# Patient Record
Sex: Female | Born: 1950
Health system: Southern US, Community
[De-identification: ages and names within clinical notes are randomized; demographics above are authoritative.]

## PROBLEM LIST (undated history)

## (undated) DIAGNOSIS — E662 Morbid (severe) obesity with alveolar hypoventilation: Secondary | ICD-10-CM

## (undated) DIAGNOSIS — K219 Gastro-esophageal reflux disease without esophagitis: Secondary | ICD-10-CM

## (undated) DIAGNOSIS — R42 Dizziness and giddiness: Secondary | ICD-10-CM

## (undated) DIAGNOSIS — Z972 Presence of dental prosthetic device (complete) (partial): Secondary | ICD-10-CM

## (undated) DIAGNOSIS — I10 Essential (primary) hypertension: Secondary | ICD-10-CM

## (undated) DIAGNOSIS — D72820 Lymphocytosis (symptomatic): Secondary | ICD-10-CM

## (undated) DIAGNOSIS — M1990 Unspecified osteoarthritis, unspecified site: Secondary | ICD-10-CM

## (undated) DIAGNOSIS — E7800 Pure hypercholesterolemia, unspecified: Secondary | ICD-10-CM

## (undated) HISTORY — PX: TOTAL HIP ARTHROPLASTY: SHX124

## (undated) HISTORY — DX: Lymphocytosis (symptomatic): D72.820

---

## 2006-12-10 ENCOUNTER — Ambulatory Visit: Payer: Self-pay

## 2010-04-03 ENCOUNTER — Encounter: Payer: Self-pay | Admitting: Physical Medicine & Rehabilitation

## 2010-04-12 ENCOUNTER — Encounter: Payer: Self-pay | Admitting: Physical Medicine & Rehabilitation

## 2010-05-12 ENCOUNTER — Encounter: Payer: Self-pay | Admitting: Physical Medicine & Rehabilitation

## 2011-02-20 DIAGNOSIS — E782 Mixed hyperlipidemia: Secondary | ICD-10-CM | POA: Insufficient documentation

## 2011-02-20 DIAGNOSIS — I10 Essential (primary) hypertension: Secondary | ICD-10-CM | POA: Insufficient documentation

## 2011-11-16 ENCOUNTER — Emergency Department: Payer: Self-pay | Admitting: *Deleted

## 2015-04-23 DIAGNOSIS — N3946 Mixed incontinence: Secondary | ICD-10-CM | POA: Insufficient documentation

## 2015-04-23 DIAGNOSIS — R7303 Prediabetes: Secondary | ICD-10-CM | POA: Insufficient documentation

## 2016-07-18 DIAGNOSIS — I10 Essential (primary) hypertension: Secondary | ICD-10-CM | POA: Diagnosis not present

## 2016-07-18 DIAGNOSIS — K210 Gastro-esophageal reflux disease with esophagitis: Secondary | ICD-10-CM | POA: Diagnosis not present

## 2016-07-19 DIAGNOSIS — E784 Other hyperlipidemia: Secondary | ICD-10-CM | POA: Diagnosis not present

## 2016-07-19 DIAGNOSIS — I10 Essential (primary) hypertension: Secondary | ICD-10-CM | POA: Diagnosis not present

## 2016-07-27 ENCOUNTER — Telehealth: Payer: Self-pay | Admitting: Gastroenterology

## 2016-07-27 NOTE — Telephone Encounter (Signed)
Colonoscopy please double check her insurance and add it. [employer] could not be processed.

## 2016-07-30 ENCOUNTER — Other Ambulatory Visit: Payer: Self-pay

## 2016-07-30 NOTE — Telephone Encounter (Signed)
Gastroenterology Pre-Procedure Review  Request Date: 08/31/2016 Requesting Physician: Dr. Lavera Guise  PATIENT REVIEW QUESTIONS: The patient responded to the following health history questions as indicated:    1. Are you having any GI issues? no 2. Do you have a personal history of Polyps? no 3. Do you have a family history of Colon Cancer or Polyps? yes (sister, benign polyps) 4. Diabetes Mellitus? no 5. Joint replacements in the past 12 months?no 6. Major health problems in the past 3 months?no 7. Any artificial heart valves, MVP, or defibrillator?no    MEDICATIONS & ALLERGIES:    Patient reports the following regarding taking any anticoagulation/antiplatelet therapy:   Plavix, Coumadin, Eliquis, Xarelto, Lovenox, Pradaxa, Brilinta, or Effient? no Aspirin? yes (blood thinner)  Patient confirms/reports the following medications:  Current Outpatient Prescriptions  Medication Sig Dispense Refill  . Amlodipine-Valsartan-HCTZ 10-160-25 MG TABS Take by mouth.    Marland Kitchen aspirin EC 81 MG tablet Take 81 mg by mouth daily.    . famotidine (PEPCID) 20 MG tablet Take 20 mg by mouth 2 (two) times daily.    Marland Kitchen lovastatin (MEVACOR) 40 MG tablet Take 40 mg by mouth at bedtime.     No current facility-administered medications for this visit.     Patient confirms/reports the following allergies:  No Known Allergies  No orders of the defined types were placed in this encounter.   AUTHORIZATION INFORMATION Primary Insurance: 1D#: Group #:  Secondary Insurance: 1D#: Group #:  SCHEDULE INFORMATION: Date: 08/31/2016 Time: Location: [facility]

## 2016-07-30 NOTE — Telephone Encounter (Signed)
Screening Colonoscopy Z12.11 GERD with esophagitis K21.0 [facility] 08/31/2016 [employer]/ medicare  Pre cert

## 2016-07-30 NOTE — Telephone Encounter (Signed)
[facility] response:   Attention Referral/precert coordinator- CPT codes [45378/43235] do not require Pre- Certification.   If you have any questions for [facility], please contact our call center at [phone].   Thank you,   [facility] Intake Specialist [facility]

## 2016-07-30 NOTE — Telephone Encounter (Signed)
Pre cert paper work has been faxed to [employer]. I will wait for the authorization

## 2016-08-01 DIAGNOSIS — E785 Hyperlipidemia, unspecified: Secondary | ICD-10-CM | POA: Diagnosis not present

## 2016-08-01 DIAGNOSIS — I10 Essential (primary) hypertension: Secondary | ICD-10-CM | POA: Diagnosis not present

## 2016-08-01 DIAGNOSIS — K210 Gastro-esophageal reflux disease with esophagitis: Secondary | ICD-10-CM | POA: Diagnosis not present

## 2016-08-24 ENCOUNTER — Encounter: Payer: Self-pay | Admitting: *Deleted

## 2016-08-28 NOTE — Discharge Instructions (Signed)

## 2016-08-31 ENCOUNTER — Encounter
Admission: RE | Disposition: A | Discharge status: Home / Self Care | Payer: Self-pay | Source: Ambulatory Visit | Attending: Gastroenterology

## 2016-08-31 ENCOUNTER — Ambulatory Visit: Payer: Commercial Managed Care - HMO | Admitting: Student in an Organized Health Care Education/Training Program

## 2016-08-31 ENCOUNTER — Ambulatory Visit
Admission: RE | Admit: 2016-08-31 | Discharge: 2016-08-31 | Disposition: A | Discharge status: Home / Self Care | Payer: Commercial Managed Care - HMO | Source: Ambulatory Visit | Attending: Gastroenterology | Admitting: Gastroenterology

## 2016-08-31 DIAGNOSIS — F17210 Nicotine dependence, cigarettes, uncomplicated: Secondary | ICD-10-CM | POA: Diagnosis not present

## 2016-08-31 DIAGNOSIS — Z79899 Other long term (current) drug therapy: Secondary | ICD-10-CM | POA: Diagnosis not present

## 2016-08-31 DIAGNOSIS — K635 Polyp of colon: Secondary | ICD-10-CM

## 2016-08-31 DIAGNOSIS — D123 Benign neoplasm of transverse colon: Secondary | ICD-10-CM

## 2016-08-31 DIAGNOSIS — K5730 Diverticulosis of large intestine without perforation or abscess without bleeding: Secondary | ICD-10-CM | POA: Diagnosis not present

## 2016-08-31 DIAGNOSIS — E7800 Pure hypercholesterolemia, unspecified: Secondary | ICD-10-CM | POA: Diagnosis not present

## 2016-08-31 DIAGNOSIS — Z96642 Presence of left artificial hip joint: Secondary | ICD-10-CM | POA: Diagnosis not present

## 2016-08-31 DIAGNOSIS — I10 Essential (primary) hypertension: Secondary | ICD-10-CM | POA: Diagnosis not present

## 2016-08-31 DIAGNOSIS — K219 Gastro-esophageal reflux disease without esophagitis: Secondary | ICD-10-CM | POA: Diagnosis not present

## 2016-08-31 DIAGNOSIS — D125 Benign neoplasm of sigmoid colon: Secondary | ICD-10-CM | POA: Diagnosis not present

## 2016-08-31 DIAGNOSIS — Z6841 Body Mass Index (BMI) 40.0 and over, adult: Secondary | ICD-10-CM | POA: Insufficient documentation

## 2016-08-31 DIAGNOSIS — Z7982 Long term (current) use of aspirin: Secondary | ICD-10-CM | POA: Diagnosis not present

## 2016-08-31 DIAGNOSIS — K210 Gastro-esophageal reflux disease with esophagitis: Secondary | ICD-10-CM | POA: Diagnosis not present

## 2016-08-31 DIAGNOSIS — K641 Second degree hemorrhoids: Secondary | ICD-10-CM | POA: Diagnosis not present

## 2016-08-31 DIAGNOSIS — Z1211 Encounter for screening for malignant neoplasm of colon: Secondary | ICD-10-CM | POA: Diagnosis not present

## 2016-08-31 HISTORY — PX: COLONOSCOPY WITH PROPOFOL: SHX5780

## 2016-08-31 HISTORY — DX: Presence of dental prosthetic device (complete) (partial): Z97.2

## 2016-08-31 HISTORY — DX: Unspecified osteoarthritis, unspecified site: M19.90

## 2016-08-31 HISTORY — PX: POLYPECTOMY: SHX5525

## 2016-08-31 HISTORY — DX: Essential (primary) hypertension: I10

## 2016-08-31 HISTORY — DX: Gastro-esophageal reflux disease without esophagitis: K21.9

## 2016-08-31 HISTORY — DX: Pure hypercholesterolemia, unspecified: E78.00

## 2016-08-31 SURGERY — COLONOSCOPY WITH PROPOFOL
Anesthesia: Monitor Anesthesia Care | Wound class: Contaminated

## 2016-08-31 MED ORDER — LIDOCAINE HCL (CARDIAC) 20 MG/ML IV SOLN
INTRAVENOUS | Status: DC | PRN
  Administered 2016-08-31: 50 mg via INTRAVENOUS

## 2016-08-31 MED ORDER — STERILE WATER FOR IRRIGATION IR SOLN
Status: DC | PRN
  Administered 2016-08-31: 11:00:00

## 2016-08-31 MED ORDER — PROPOFOL 10 MG/ML IV BOLUS
INTRAVENOUS | Status: DC | PRN
  Administered 2016-08-31: 20 mg via INTRAVENOUS
  Administered 2016-08-31: 30 mg via INTRAVENOUS
  Administered 2016-08-31 (×5): 20 mg via INTRAVENOUS
  Administered 2016-08-31: 50 mg via INTRAVENOUS
  Administered 2016-08-31: 20 mg via INTRAVENOUS

## 2016-08-31 MED ORDER — LACTATED RINGERS IV SOLN
INTRAVENOUS | Status: DC | PRN
  Administered 2016-08-31: 11:00:00 via INTRAVENOUS

## 2016-08-31 SURGICAL SUPPLY — 23 items

## 2016-08-31 NOTE — H&P (Signed)
  Lucilla Lame, MD [facility] [address], [place], [place] [address] Phone: [phone] Fax : [phone]  Primary Care Physician:  No primary care provider on file. Primary Gastroenterologist:  Dr. Allen Norris  Pre-Procedure History & Physical: HPI:  Samantha Payne is a 65 y.o. female is here for a screening colonoscopy.   Past Medical History:  Diagnosis Date  . Arthritis    legs, back, shoulders  . GERD (gastroesophageal reflux disease)   . Hypercholesteremia   . Hypertension   . Wears dentures    partial upper and lower    Past Surgical History:  Procedure Laterality Date  . CESAREAN SECTION    . TOTAL HIP ARTHROPLASTY Left     Prior to Admission medications   Medication Sig Start Date End Date Taking? Authorizing Provider  Amlodipine-Valsartan-HCTZ 10-160-25 MG TABS Take by mouth.   Yes Historical Provider, MD  aspirin EC 81 MG tablet Take 81 mg by mouth daily.   Yes Historical Provider, MD  famotidine (PEPCID) 20 MG tablet Take 20 mg by mouth 2 (two) times daily.   Yes Historical Provider, MD  lovastatin (MEVACOR) 40 MG tablet Take 40 mg by mouth at bedtime.   Yes Historical Provider, MD    Allergies as of 07/30/2016  . (No Known Allergies)    History reviewed. No pertinent family history.  Social History   Social History  . Marital status: Single    Spouse name: N/A  . Number of children: N/A  . Years of education: N/A   Occupational History  . Not on file.   Social History Main Topics  . Smoking status: Current Every Day Smoker    Packs/day: 0.50    Years: 50.00    Types: Cigarettes  . Smokeless tobacco: Never Used  . Alcohol use 0.6 oz/week    1 Shots of liquor per week  . Drug use: Unknown  . Sexual activity: Not on file   Other Topics Concern  . Not on file   Social History Narrative  . No narrative on file    Review of Systems: See HPI, otherwise negative ROS  Physical Exam: BP 136/85   Pulse 72   Temp 98 F (36.7 C) (Temporal)    Ht 5\' 3"  (1.6 m)   Wt 240 lb (108.9 kg)   SpO2 98%   BMI 42.51 kg/m  General:   Alert,  pleasant and cooperative in NAD Head:  Normocephalic and atraumatic. Neck:  Supple; no masses or thyromegaly. Lungs:  Clear throughout to auscultation.    Heart:  Regular rate and rhythm. Abdomen:  Soft, nontender and nondistended. Normal bowel sounds, without guarding, and without rebound.   Neurologic:  Alert and  oriented x4;  grossly normal neurologically.  Impression/Plan: Samantha Payne is now here to undergo a screening colonoscopy.  Risks, benefits, and alternatives regarding colonoscopy have been reviewed with the patient.  Questions have been answered.  All parties agreeable.

## 2016-08-31 NOTE — Transfer of Care (Signed)
Immediate Anesthesia Transfer of Care Note  Patient: Samantha Payne  Procedure(s) Performed: Procedure(s): COLONOSCOPY WITH PROPOFOL (N/A) POLYPECTOMY  Patient Location: PACU  Anesthesia Type: MAC  Level of Consciousness: awake, alert  and patient cooperative  Airway and Oxygen Therapy: Patient Spontanous Breathing and Patient connected to supplemental oxygen  Post-op Assessment: Post-op Vital signs reviewed, Patient's Cardiovascular Status Stable, Respiratory Function Stable, Patent Airway and No signs of Nausea or vomiting  Post-op Vital Signs: Reviewed and stable  Complications: No apparent anesthesia complications

## 2016-08-31 NOTE — Anesthesia Procedure Notes (Signed)
Procedure Name: MAC Performed by: Kasaundra Fahrney Pre-anesthesia Checklist: Patient identified, Emergency Drugs available, Suction available, Timeout performed and Patient being monitored Patient Re-evaluated:Patient Re-evaluated prior to inductionOxygen Delivery Method: Nasal cannula Placement Confirmation: positive ETCO2       

## 2016-08-31 NOTE — Anesthesia Postprocedure Evaluation (Signed)
Anesthesia Post Note  Patient: Samantha Payne  Procedure(s) Performed: Procedure(s) (LRB): COLONOSCOPY WITH PROPOFOL (N/A) POLYPECTOMY  Patient location during evaluation: PACU Anesthesia Type: MAC Level of consciousness: awake and alert and oriented Pain management: satisfactory to patient Vital Signs Assessment: post-procedure vital signs reviewed and stable Respiratory status: spontaneous breathing, nonlabored ventilation and respiratory function stable Cardiovascular status: blood pressure returned to baseline and stable Postop Assessment: Adequate PO intake and No signs of nausea or vomiting Anesthetic complications: no    Raliegh Ip

## 2016-08-31 NOTE — Anesthesia Preprocedure Evaluation (Signed)
Anesthesia Evaluation  Patient identified by MRN, date of birth, ID band Patient awake    Reviewed: Allergy & Precautions, H&P , NPO status , Patient's Chart, lab work & pertinent test results  Airway Mallampati: III  TM Distance: >3 FB Neck ROM: full    Dental  (+) Loose, Dental Advidsory Given, Poor Dentition   Pulmonary Current Smoker,    Pulmonary exam normal        Cardiovascular hypertension, Normal cardiovascular exam     Neuro/Psych    GI/Hepatic GERD  ,  Endo/Other  Morbid obesity  Renal/GU      Musculoskeletal   Abdominal   Peds  Hematology   Anesthesia Other Findings   Reproductive/Obstetrics                             Anesthesia Physical Anesthesia Plan  ASA: II  Anesthesia Plan: MAC   Post-op Pain Management:    Induction:   Airway Management Planned:   Additional Equipment:   Intra-op Plan:   Post-operative Plan:   Informed Consent: I have reviewed the patients History and Physical, chart, labs and discussed the procedure including the risks, benefits and alternatives for the proposed anesthesia with the patient or authorized representative who has indicated his/her understanding and acceptance.     Plan Discussed with:   Anesthesia Plan Comments:         Anesthesia Quick Evaluation

## 2016-08-31 NOTE — Op Note (Signed)
[facility] Gastroenterology Patient Name: Samantha Payne Procedure Date: 08/31/2016 10:44 AM MRN: [id number] Account #: [id number] Date of Birth: 07/01/1951 Admit Type: Outpatient Age: 65 Room: [facility] OR ROOM 01 Gender: Female Note Status: Finalized Procedure:            Colonoscopy Indications:          Screening for colorectal malignant neoplasm Providers:            Lucilla Lame MD, MD Referring MD:         Cletis Athens, MD (Referring MD) Medicines:            Propofol per Anesthesia Complications:        No immediate complications. Procedure:            Pre-Anesthesia Assessment:                       - Prior to the procedure, a History and Physical was                        performed, and patient medications and allergies were                        reviewed. The patient's tolerance of previous                        anesthesia was also reviewed. The risks and benefits of                        the procedure and the sedation options and risks were                        discussed with the patient. All questions were                        answered, and informed consent was obtained. Prior                        Anticoagulants: The patient has taken no previous                        anticoagulant or antiplatelet agents. ASA Grade                        Assessment: II - A patient with mild systemic disease.                        After reviewing the risks and benefits, the patient was                        deemed in satisfactory condition to undergo the                        procedure.                       After obtaining informed consent, the colonoscope was                        passed under direct vision. Throughout the procedure,  the patient's blood pressure, pulse, and oxygen                        saturations were monitored continuously. The Olympus                        CF-HQ190L Colonoscope (S#. [id number]) was introduced                   through the anus and advanced to the the cecum,                        identified by appendiceal orifice and ileocecal valve.                        The colonoscopy was performed without difficulty. The                        patient tolerated the procedure well. The quality of                        the bowel preparation was good. Findings:      The perianal and digital rectal examinations were normal.      A 3 mm polyp was found in the transverse colon. The polyp was sessile.       The polyp was removed with a cold biopsy forceps. Resection and       retrieval were complete.      Three sessile polyps were found in the sigmoid colon. The polyps were 2       to 3 mm in size. These polyps were removed with a cold biopsy forceps.       Resection and retrieval were complete.      A few small-mouthed diverticula were found in the sigmoid colon.      Non-bleeding internal hemorrhoids were found during retroflexion. The       hemorrhoids were Grade II (internal hemorrhoids that prolapse but reduce       spontaneously). Impression:           - One 3 mm polyp in the transverse colon, removed with                        a cold biopsy forceps. Resected and retrieved.                       - Three 2 to 3 mm polyps in the sigmoid colon, removed                        with a cold biopsy forceps. Resected and retrieved.                       - Diverticulosis in the sigmoid colon.                       - Non-bleeding internal hemorrhoids. Recommendation:       - Discharge patient to home.                       - Resume previous diet.                       -  Continue present medications.                       - Repeat colonoscopy in 5 years if polyp adenoma and 10                        years if hyperplastic Procedure Code(s):    --- Professional ---                       [phone], Colonoscopy, flexible; with biopsy, single or                        multiple Diagnosis Code(s):    ---  Professional ---                       Z12.11, Encounter for screening for malignant neoplasm                        of colon                       D12.3, Benign neoplasm of transverse colon (hepatic                        flexure or splenic flexure)                       D12.5, Benign neoplasm of sigmoid colon CPT copyright 2016 American Medical Association. All rights reserved. The codes documented in this report are preliminary and upon coder review may  be revised to meet current compliance requirements. Lucilla Lame MD, MD 08/31/2016 11:07:17 AM This report has been signed electronically. Number of Addenda: 0 Note Initiated On: 08/31/2016 10:44 AM Scope Withdrawal Time: 0 hours 5 minutes 2 seconds  Total Procedure Duration: 0 hours 10 minutes 30 seconds       [facility]

## 2016-09-03 ENCOUNTER — Encounter: Payer: Self-pay | Admitting: Gastroenterology

## 2016-09-05 ENCOUNTER — Encounter: Payer: Self-pay | Admitting: Gastroenterology

## 2016-10-24 DIAGNOSIS — I10 Essential (primary) hypertension: Secondary | ICD-10-CM | POA: Diagnosis not present

## 2016-10-24 DIAGNOSIS — E669 Obesity, unspecified: Secondary | ICD-10-CM | POA: Diagnosis not present

## 2016-10-24 DIAGNOSIS — E785 Hyperlipidemia, unspecified: Secondary | ICD-10-CM | POA: Diagnosis not present

## 2017-01-25 DIAGNOSIS — I10 Essential (primary) hypertension: Secondary | ICD-10-CM | POA: Diagnosis not present

## 2017-01-25 DIAGNOSIS — E669 Obesity, unspecified: Secondary | ICD-10-CM | POA: Diagnosis not present

## 2017-01-25 DIAGNOSIS — M1990 Unspecified osteoarthritis, unspecified site: Secondary | ICD-10-CM | POA: Diagnosis not present

## 2017-01-25 DIAGNOSIS — E785 Hyperlipidemia, unspecified: Secondary | ICD-10-CM | POA: Diagnosis not present

## 2017-02-01 DIAGNOSIS — R079 Chest pain, unspecified: Secondary | ICD-10-CM | POA: Diagnosis not present

## 2017-02-01 DIAGNOSIS — I10 Essential (primary) hypertension: Secondary | ICD-10-CM | POA: Diagnosis not present

## 2017-02-01 DIAGNOSIS — R0789 Other chest pain: Secondary | ICD-10-CM | POA: Diagnosis not present

## 2017-02-01 DIAGNOSIS — E785 Hyperlipidemia, unspecified: Secondary | ICD-10-CM | POA: Diagnosis not present

## 2017-02-21 ENCOUNTER — Other Ambulatory Visit: Payer: Self-pay | Admitting: Internal Medicine

## 2017-02-21 DIAGNOSIS — Z1231 Encounter for screening mammogram for malignant neoplasm of breast: Secondary | ICD-10-CM

## 2017-03-15 ENCOUNTER — Ambulatory Visit
Admission: RE | Admit: 2017-03-15 | Discharge: 2017-03-15 | Disposition: A | Discharge status: Home / Self Care | Payer: Commercial Managed Care - HMO | Source: Ambulatory Visit | Attending: Internal Medicine | Admitting: Internal Medicine

## 2017-03-15 DIAGNOSIS — Z1231 Encounter for screening mammogram for malignant neoplasm of breast: Secondary | ICD-10-CM | POA: Diagnosis not present

## 2017-03-22 ENCOUNTER — Inpatient Hospital Stay
Admission: RE | Admit: 2017-03-22 | Discharge: 2017-03-22 | Disposition: A | Discharge status: Home / Self Care | Payer: Self-pay | Source: Ambulatory Visit | Attending: *Deleted | Admitting: *Deleted

## 2017-03-22 ENCOUNTER — Other Ambulatory Visit: Payer: Self-pay | Admitting: *Deleted

## 2017-03-22 DIAGNOSIS — Z9289 Personal history of other medical treatment: Secondary | ICD-10-CM

## 2017-04-12 DIAGNOSIS — E785 Hyperlipidemia, unspecified: Secondary | ICD-10-CM | POA: Diagnosis not present

## 2017-04-12 DIAGNOSIS — I10 Essential (primary) hypertension: Secondary | ICD-10-CM | POA: Diagnosis not present

## 2017-04-12 DIAGNOSIS — H663X9 Other chronic suppurative otitis media, unspecified ear: Secondary | ICD-10-CM | POA: Diagnosis not present

## 2017-04-12 DIAGNOSIS — H608X1 Other otitis externa, right ear: Secondary | ICD-10-CM | POA: Diagnosis not present

## 2017-04-25 DIAGNOSIS — I10 Essential (primary) hypertension: Secondary | ICD-10-CM | POA: Diagnosis not present

## 2017-04-25 DIAGNOSIS — H663X9 Other chronic suppurative otitis media, unspecified ear: Secondary | ICD-10-CM | POA: Diagnosis not present

## 2017-04-25 DIAGNOSIS — M1990 Unspecified osteoarthritis, unspecified site: Secondary | ICD-10-CM | POA: Diagnosis not present

## 2017-04-25 DIAGNOSIS — E785 Hyperlipidemia, unspecified: Secondary | ICD-10-CM | POA: Diagnosis not present

## 2017-09-13 DIAGNOSIS — E7849 Other hyperlipidemia: Secondary | ICD-10-CM | POA: Diagnosis not present

## 2017-09-13 DIAGNOSIS — I10 Essential (primary) hypertension: Secondary | ICD-10-CM | POA: Diagnosis not present

## 2017-09-13 DIAGNOSIS — H663X9 Other chronic suppurative otitis media, unspecified ear: Secondary | ICD-10-CM | POA: Diagnosis not present

## 2017-09-13 DIAGNOSIS — E785 Hyperlipidemia, unspecified: Secondary | ICD-10-CM | POA: Diagnosis not present

## 2017-09-13 DIAGNOSIS — E7841 Elevated Lipoprotein(a): Secondary | ICD-10-CM | POA: Diagnosis not present

## 2017-09-13 DIAGNOSIS — M1990 Unspecified osteoarthritis, unspecified site: Secondary | ICD-10-CM | POA: Diagnosis not present

## 2017-09-13 DIAGNOSIS — H608X1 Other otitis externa, right ear: Secondary | ICD-10-CM | POA: Diagnosis not present

## 2017-11-19 DIAGNOSIS — M1990 Unspecified osteoarthritis, unspecified site: Secondary | ICD-10-CM | POA: Diagnosis not present

## 2017-11-19 DIAGNOSIS — E785 Hyperlipidemia, unspecified: Secondary | ICD-10-CM | POA: Diagnosis not present

## 2017-11-19 DIAGNOSIS — I10 Essential (primary) hypertension: Secondary | ICD-10-CM | POA: Diagnosis not present

## 2017-11-19 DIAGNOSIS — H608X1 Other otitis externa, right ear: Secondary | ICD-10-CM | POA: Diagnosis not present

## 2018-02-18 DIAGNOSIS — E785 Hyperlipidemia, unspecified: Secondary | ICD-10-CM | POA: Diagnosis not present

## 2018-02-18 DIAGNOSIS — I10 Essential (primary) hypertension: Secondary | ICD-10-CM | POA: Diagnosis not present

## 2018-02-18 DIAGNOSIS — H608X1 Other otitis externa, right ear: Secondary | ICD-10-CM | POA: Diagnosis not present

## 2018-02-18 DIAGNOSIS — M1990 Unspecified osteoarthritis, unspecified site: Secondary | ICD-10-CM | POA: Diagnosis not present

## 2018-02-21 ENCOUNTER — Other Ambulatory Visit: Payer: Self-pay | Admitting: Internal Medicine

## 2018-02-21 DIAGNOSIS — Z1231 Encounter for screening mammogram for malignant neoplasm of breast: Secondary | ICD-10-CM

## 2018-03-18 ENCOUNTER — Ambulatory Visit
Admission: RE | Admit: 2018-03-18 | Discharge: 2018-03-18 | Disposition: A | Discharge status: Home / Self Care | Payer: Medicare Other | Source: Ambulatory Visit | Attending: Internal Medicine | Admitting: Internal Medicine

## 2018-03-18 DIAGNOSIS — Z1231 Encounter for screening mammogram for malignant neoplasm of breast: Secondary | ICD-10-CM | POA: Diagnosis not present

## 2018-03-18 IMAGING — MG MM DIGITAL SCREENING BILAT W/ CAD
4 series · 4 of 4 positions shown · non-contrast
Comparison: Previous exam(s).

CLINICAL DATA: Screening.

EXAM:
DIGITAL SCREENING BILATERAL MAMMOGRAM WITH CAD

[L MLO]
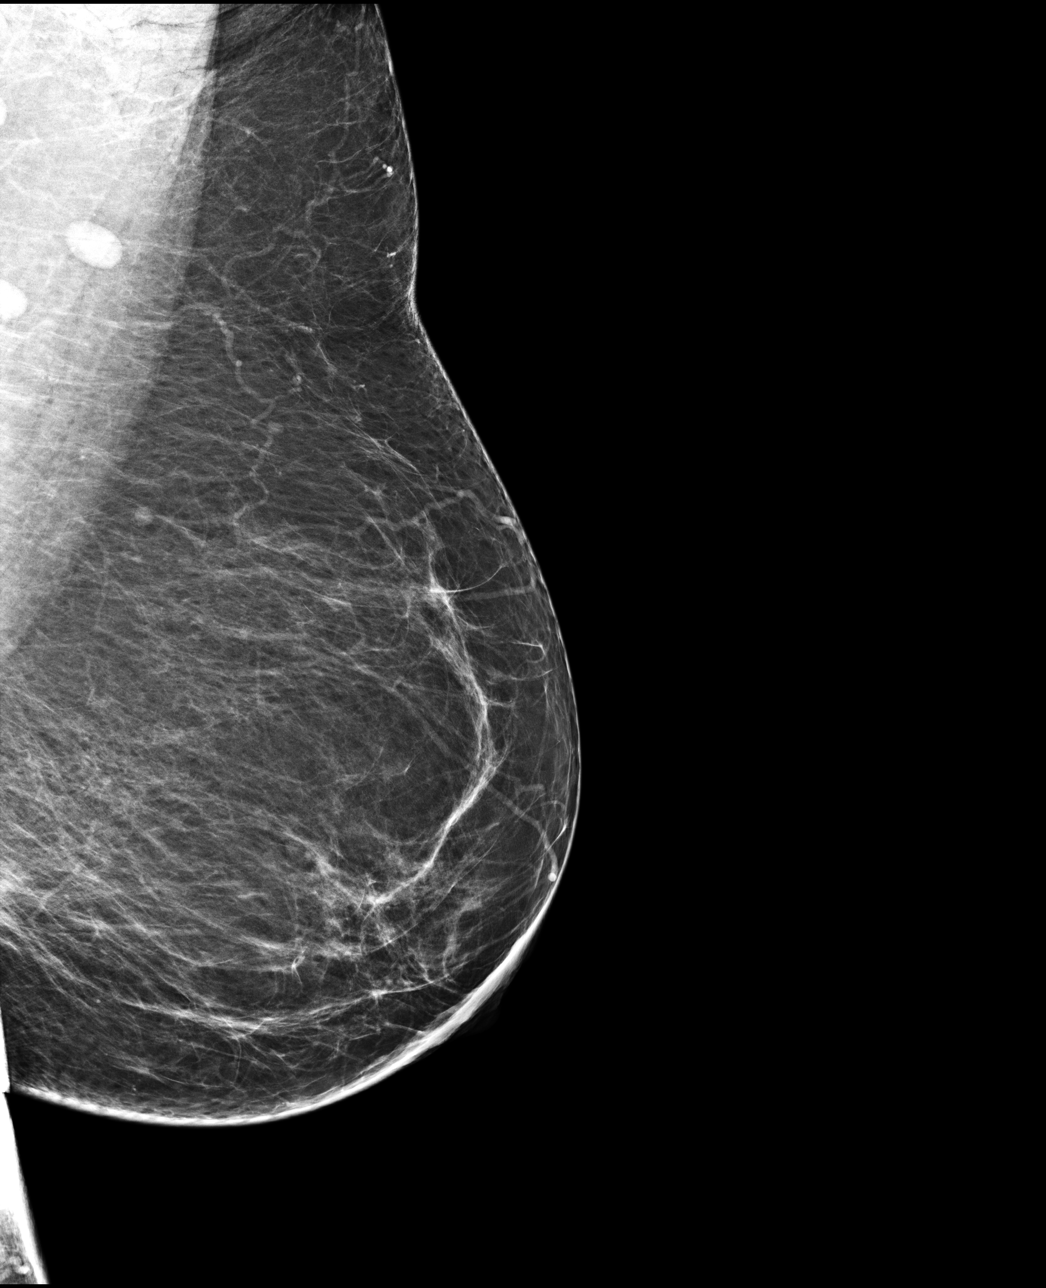

[L CC]
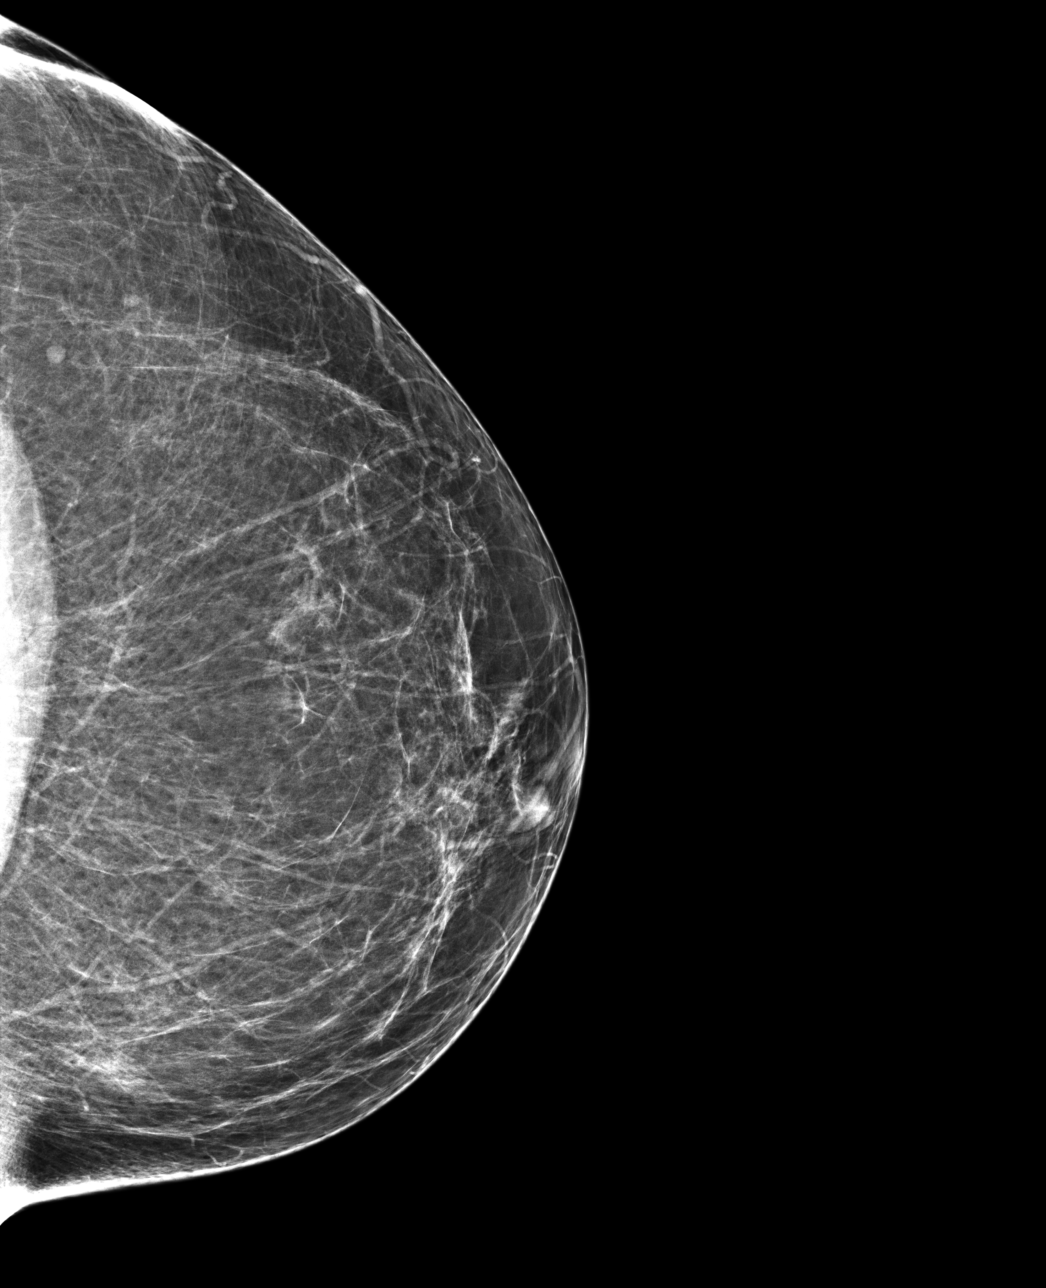

[R CC]
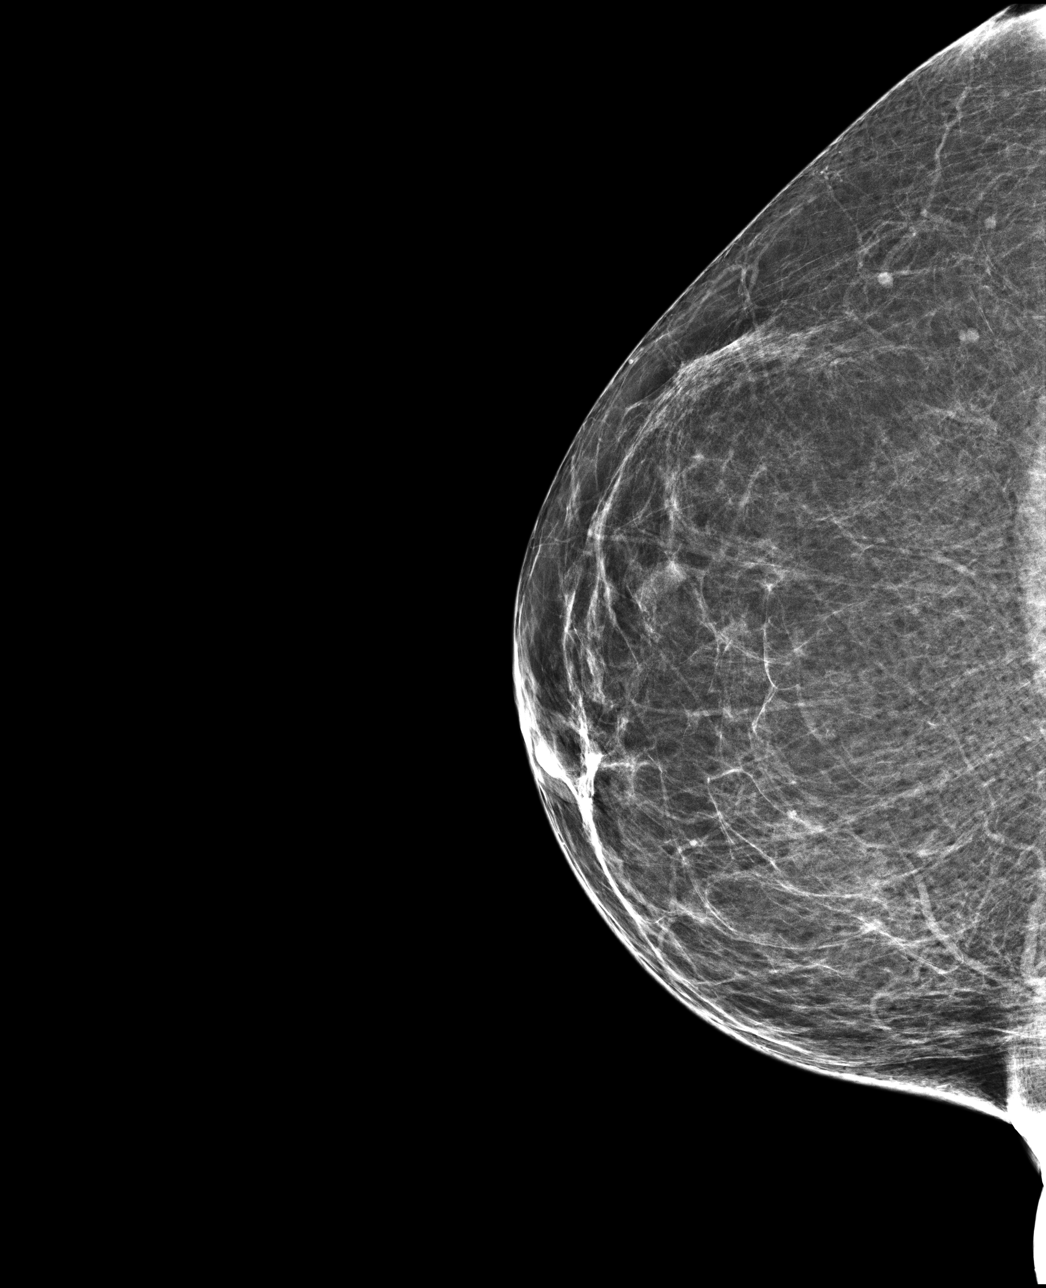

[R MLO]
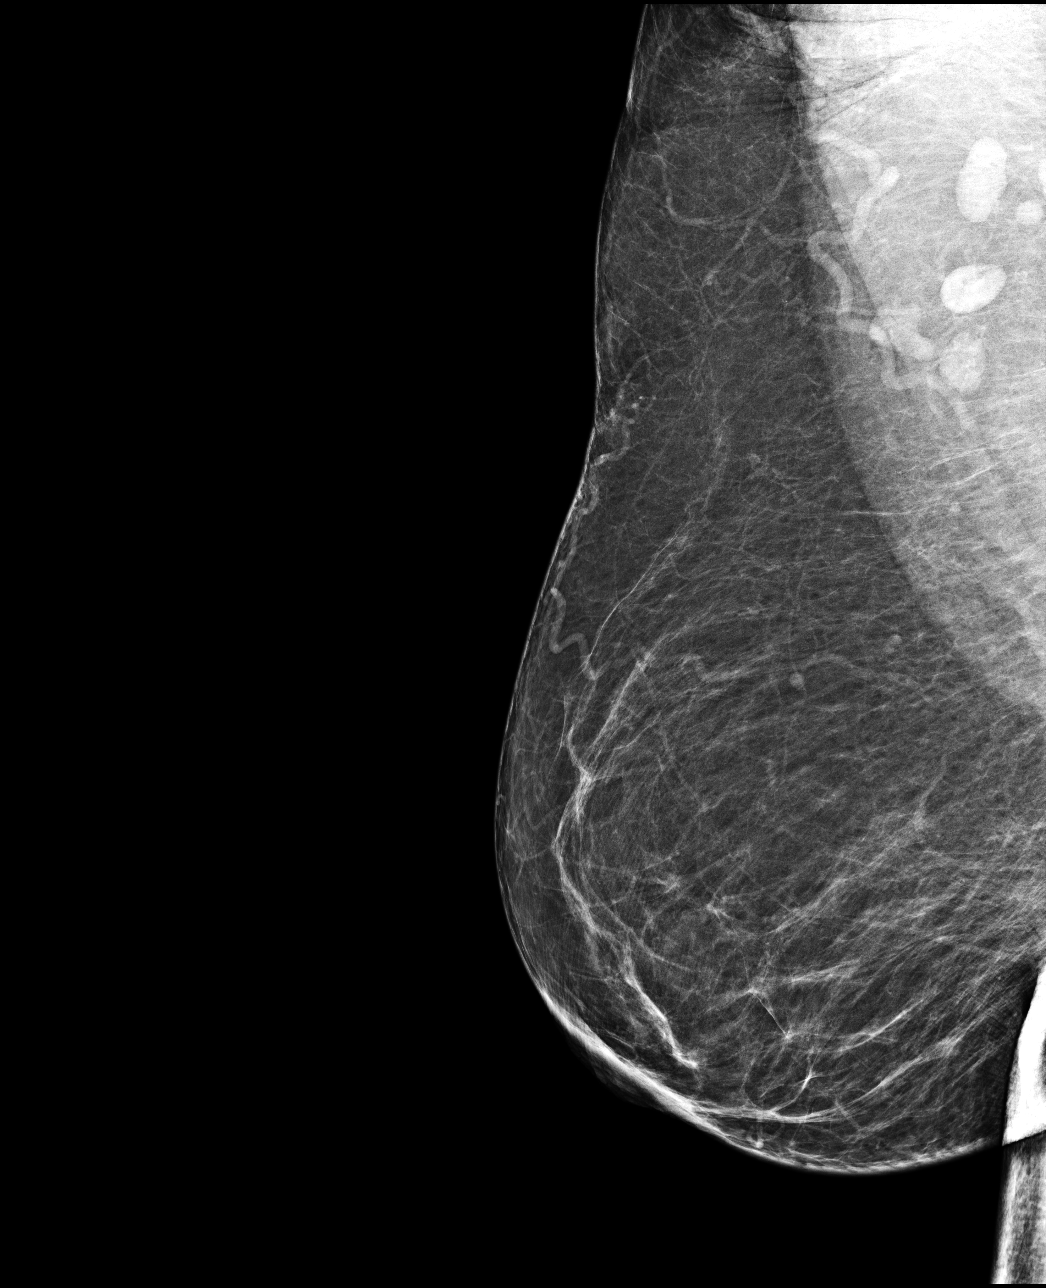

[4 of 4 positions shown; findings below may reference images not displayed]

ACR Breast Density Category b: There are scattered areas of
fibroglandular density.
FINDINGS: In the left breast, a possible asymmetry warrants further
evaluation. In the right breast, no findings suspicious for
malignancy. Images were processed with CAD.
IMPRESSION: Further evaluation is suggested for possible asymmetry in the left
breast.

RECOMMENDATION:
Diagnostic mammogram and possibly ultrasound of the left breast.
(Code:[RA])

The patient will be contacted regarding the findings, and additional
imaging will be scheduled.

BI-RADS CATEGORY  0: Incomplete. Need additional imaging evaluation
and/or prior mammograms for comparison.

## 2018-03-24 ENCOUNTER — Other Ambulatory Visit: Payer: Self-pay | Admitting: Internal Medicine

## 2018-03-24 DIAGNOSIS — N6489 Other specified disorders of breast: Secondary | ICD-10-CM

## 2018-03-24 DIAGNOSIS — R928 Other abnormal and inconclusive findings on diagnostic imaging of breast: Secondary | ICD-10-CM

## 2018-04-03 ENCOUNTER — Ambulatory Visit
Admission: RE | Admit: 2018-04-03 | Discharge: 2018-04-03 | Disposition: A | Discharge status: Home / Self Care | Payer: Medicare Other | Source: Ambulatory Visit | Attending: Internal Medicine | Admitting: Internal Medicine

## 2018-04-03 DIAGNOSIS — R928 Other abnormal and inconclusive findings on diagnostic imaging of breast: Secondary | ICD-10-CM

## 2018-04-03 DIAGNOSIS — N6489 Other specified disorders of breast: Secondary | ICD-10-CM | POA: Diagnosis not present

## 2018-05-20 DIAGNOSIS — M1990 Unspecified osteoarthritis, unspecified site: Secondary | ICD-10-CM | POA: Diagnosis not present

## 2018-05-20 DIAGNOSIS — I10 Essential (primary) hypertension: Secondary | ICD-10-CM | POA: Diagnosis not present

## 2018-05-20 DIAGNOSIS — M160 Bilateral primary osteoarthritis of hip: Secondary | ICD-10-CM | POA: Diagnosis not present

## 2018-05-20 DIAGNOSIS — E785 Hyperlipidemia, unspecified: Secondary | ICD-10-CM | POA: Diagnosis not present

## 2018-08-26 DIAGNOSIS — E785 Hyperlipidemia, unspecified: Secondary | ICD-10-CM | POA: Diagnosis not present

## 2018-08-26 DIAGNOSIS — M160 Bilateral primary osteoarthritis of hip: Secondary | ICD-10-CM | POA: Diagnosis not present

## 2018-08-26 DIAGNOSIS — H663X9 Other chronic suppurative otitis media, unspecified ear: Secondary | ICD-10-CM | POA: Diagnosis not present

## 2018-09-12 DIAGNOSIS — H663X9 Other chronic suppurative otitis media, unspecified ear: Secondary | ICD-10-CM | POA: Diagnosis not present

## 2018-09-12 DIAGNOSIS — E785 Hyperlipidemia, unspecified: Secondary | ICD-10-CM | POA: Diagnosis not present

## 2018-09-12 DIAGNOSIS — M160 Bilateral primary osteoarthritis of hip: Secondary | ICD-10-CM | POA: Diagnosis not present

## 2018-10-13 DIAGNOSIS — I119 Hypertensive heart disease without heart failure: Secondary | ICD-10-CM | POA: Diagnosis not present

## 2018-10-13 DIAGNOSIS — E785 Hyperlipidemia, unspecified: Secondary | ICD-10-CM | POA: Diagnosis not present

## 2018-10-13 DIAGNOSIS — I10 Essential (primary) hypertension: Secondary | ICD-10-CM | POA: Diagnosis not present

## 2018-10-13 DIAGNOSIS — M1990 Unspecified osteoarthritis, unspecified site: Secondary | ICD-10-CM | POA: Diagnosis not present

## 2018-10-15 DIAGNOSIS — E7849 Other hyperlipidemia: Secondary | ICD-10-CM | POA: Diagnosis not present

## 2018-10-15 DIAGNOSIS — I10 Essential (primary) hypertension: Secondary | ICD-10-CM | POA: Diagnosis not present

## 2018-10-15 DIAGNOSIS — E7841 Elevated Lipoprotein(a): Secondary | ICD-10-CM | POA: Diagnosis not present

## 2018-10-21 DIAGNOSIS — M1990 Unspecified osteoarthritis, unspecified site: Secondary | ICD-10-CM | POA: Diagnosis not present

## 2018-10-21 DIAGNOSIS — I10 Essential (primary) hypertension: Secondary | ICD-10-CM | POA: Diagnosis not present

## 2018-10-21 DIAGNOSIS — E785 Hyperlipidemia, unspecified: Secondary | ICD-10-CM | POA: Diagnosis not present

## 2018-10-21 DIAGNOSIS — I119 Hypertensive heart disease without heart failure: Secondary | ICD-10-CM | POA: Diagnosis not present

## 2019-01-08 DIAGNOSIS — H663X9 Other chronic suppurative otitis media, unspecified ear: Secondary | ICD-10-CM | POA: Diagnosis not present

## 2019-01-08 DIAGNOSIS — M160 Bilateral primary osteoarthritis of hip: Secondary | ICD-10-CM | POA: Diagnosis not present

## 2019-01-08 DIAGNOSIS — I119 Hypertensive heart disease without heart failure: Secondary | ICD-10-CM | POA: Diagnosis not present

## 2019-04-10 DIAGNOSIS — Z0000 Encounter for general adult medical examination without abnormal findings: Secondary | ICD-10-CM | POA: Diagnosis not present

## 2019-04-10 DIAGNOSIS — M1990 Unspecified osteoarthritis, unspecified site: Secondary | ICD-10-CM | POA: Diagnosis not present

## 2019-04-10 DIAGNOSIS — H608X1 Other otitis externa, right ear: Secondary | ICD-10-CM | POA: Diagnosis not present

## 2019-04-10 DIAGNOSIS — E785 Hyperlipidemia, unspecified: Secondary | ICD-10-CM | POA: Diagnosis not present

## 2019-05-05 ENCOUNTER — Other Ambulatory Visit: Payer: Self-pay

## 2019-05-05 NOTE — Patient Outreach (Signed)
[facility]) Care Management  05/05/2019  Samantha Payne 1951-04-19 [id number]   Medication Adherence call to Samantha Payne  [place] Identifiers Verify spoke with patient she is past due on Amlodipine/Valsartan/Hctz 10/160/25 mg patient explain she is taking 1 tablet daily and never miss a dose she has a prescription ready at [facility] and will pick up tomorrow. Samantha Payne is showing past due under [facility].   [facility] Management Direct Dial [phone]  Fax [phone] [email or web address]

## 2019-06-09 DIAGNOSIS — I119 Hypertensive heart disease without heart failure: Secondary | ICD-10-CM | POA: Diagnosis not present

## 2019-06-09 DIAGNOSIS — M160 Bilateral primary osteoarthritis of hip: Secondary | ICD-10-CM | POA: Diagnosis not present

## 2019-06-09 DIAGNOSIS — Z0000 Encounter for general adult medical examination without abnormal findings: Secondary | ICD-10-CM | POA: Diagnosis not present

## 2019-06-09 DIAGNOSIS — H10023 Other mucopurulent conjunctivitis, bilateral: Secondary | ICD-10-CM | POA: Diagnosis not present

## 2019-06-17 DIAGNOSIS — I119 Hypertensive heart disease without heart failure: Secondary | ICD-10-CM | POA: Diagnosis not present

## 2019-06-17 DIAGNOSIS — H109 Unspecified conjunctivitis: Secondary | ICD-10-CM | POA: Diagnosis not present

## 2019-06-17 DIAGNOSIS — H608X1 Other otitis externa, right ear: Secondary | ICD-10-CM | POA: Diagnosis not present

## 2019-06-17 DIAGNOSIS — R5383 Other fatigue: Secondary | ICD-10-CM | POA: Diagnosis not present

## 2020-01-19 DIAGNOSIS — Z0189 Encounter for other specified special examinations: Secondary | ICD-10-CM | POA: Diagnosis not present

## 2020-01-22 ENCOUNTER — Ambulatory Visit: Payer: Medicare Other | Attending: Internal Medicine

## 2020-01-22 DIAGNOSIS — E785 Hyperlipidemia, unspecified: Secondary | ICD-10-CM | POA: Diagnosis not present

## 2020-01-22 DIAGNOSIS — Z23 Encounter for immunization: Secondary | ICD-10-CM

## 2020-01-22 DIAGNOSIS — M1990 Unspecified osteoarthritis, unspecified site: Secondary | ICD-10-CM | POA: Diagnosis not present

## 2020-01-22 DIAGNOSIS — R5383 Other fatigue: Secondary | ICD-10-CM | POA: Diagnosis not present

## 2020-01-22 NOTE — Progress Notes (Signed)
   Covid-19 Vaccination Clinic  Name:  Samantha Payne    MRN: [id number] DOB: Dec 31, 1950  01/22/2020  Samantha Payne was observed post Covid-19 immunization for 15 minutes without incident. She was provided with Vaccine Information Sheet and instruction to access the V-Safe system.   Samantha Payne was instructed to call 911 with any severe reactions post vaccine: Marland Kitchen Difficulty breathing  . Swelling of face and throat  . A fast heartbeat  . A bad rash all over body  . Dizziness and weakness   Immunizations Administered    Name Date Dose VIS Date Route   Moderna COVID-19 Vaccine 01/22/2020  9:47 AM 0.5 mL 10/13/2019 Intramuscular   Manufacturer: Moderna   Lot: [id number]   [id number]

## 2020-02-05 ENCOUNTER — Inpatient Hospital Stay: Payer: Medicare Other

## 2020-02-05 ENCOUNTER — Inpatient Hospital Stay: Payer: Medicare Other | Attending: Oncology | Admitting: Oncology

## 2020-02-05 ENCOUNTER — Other Ambulatory Visit: Payer: Self-pay

## 2020-02-05 ENCOUNTER — Encounter: Payer: Self-pay | Admitting: Oncology

## 2020-02-05 ENCOUNTER — Encounter (INDEPENDENT_AMBULATORY_CARE_PROVIDER_SITE_OTHER): Payer: Self-pay

## 2020-02-05 VITALS — BP 157/81 | HR 67 | Temp 97.7°F | Resp 16 | Wt 226.7 lb

## 2020-02-05 DIAGNOSIS — D72820 Lymphocytosis (symptomatic): Secondary | ICD-10-CM

## 2020-02-05 DIAGNOSIS — I10 Essential (primary) hypertension: Secondary | ICD-10-CM | POA: Insufficient documentation

## 2020-02-05 DIAGNOSIS — R5383 Other fatigue: Secondary | ICD-10-CM | POA: Diagnosis not present

## 2020-02-05 DIAGNOSIS — K219 Gastro-esophageal reflux disease without esophagitis: Secondary | ICD-10-CM | POA: Insufficient documentation

## 2020-02-05 DIAGNOSIS — F17210 Nicotine dependence, cigarettes, uncomplicated: Secondary | ICD-10-CM | POA: Insufficient documentation

## 2020-02-05 DIAGNOSIS — E785 Hyperlipidemia, unspecified: Secondary | ICD-10-CM | POA: Insufficient documentation

## 2020-02-05 DIAGNOSIS — R5381 Other malaise: Secondary | ICD-10-CM | POA: Diagnosis not present

## 2020-02-05 DIAGNOSIS — Z79899 Other long term (current) drug therapy: Secondary | ICD-10-CM | POA: Insufficient documentation

## 2020-02-05 LAB — CBC WITH DIFFERENTIAL/PLATELET
Abs Immature Granulocytes: 0.04 10*3/uL (ref 0.00–0.07)
Basophils Absolute: 0 10*3/uL (ref 0.0–0.1)
Basophils Relative: 0 %
Eosinophils Absolute: 0.1 10*3/uL (ref 0.0–0.5)
Eosinophils Relative: 1 %
HCT: 40.1 % (ref 36.0–46.0)
Hemoglobin: 13.5 g/dL (ref 12.0–15.0)
Immature Granulocytes: 0 %
Lymphocytes Relative: 29 %
Lymphs Abs: 2.9 10*3/uL (ref 0.7–4.0)
MCH: 27.7 pg (ref 26.0–34.0)
MCHC: 33.7 g/dL (ref 30.0–36.0)
MCV: 82.3 fL (ref 80.0–100.0)
Monocytes Absolute: 0.6 10*3/uL (ref 0.1–1.0)
Monocytes Relative: 6 %
Neutro Abs: 6.6 10*3/uL (ref 1.7–7.7)
Neutrophils Relative %: 64 %
Platelets: 342 10*3/uL (ref 150–400)
RBC: 4.87 MIL/uL (ref 3.87–5.11)
RDW: 15.4 % (ref 11.5–15.5)
WBC: 10.2 10*3/uL (ref 4.0–10.5)
nRBC: 0 % (ref 0.0–0.2)

## 2020-02-05 LAB — TECHNOLOGIST SMEAR REVIEW: Tech Review: NORMAL

## 2020-02-05 NOTE — Progress Notes (Signed)
Pt new pt for lymphocytosis. She is anxious about coming here today. I told her that md will see her and do some blood work.

## 2020-02-09 ENCOUNTER — Encounter: Payer: Self-pay | Admitting: Oncology

## 2020-02-09 NOTE — Progress Notes (Signed)
Hematology/Oncology Consult note [name] Telephone:([phone] [phone]  Patient Care Team: Cletis Athens, MD as PCP - General (Internal Medicine)   Name of the patient: Samantha Payne  [id number]  19-Oct-1951    Reason for referral-lymphocytosis   Referring physician-Dr. Lavera Guise  Date of visit: 02/09/20   History of presenting illness- Patient is a 69 year old African-American female with a past medical history significant for hypertension and GERD and hyperlipidemia among other medical problems.  She has been referred to [place] for lymphocytosis.  Her most recent labs done on 01/11/2020 showed a white cell count of 10.6, H&H of 13.9/42.3 and a platelet count of 390.  There was mild absolute lymphocytosis noted with a count of 4049.  I do not have any prior problems for comparison patient reports that her appetite and weight are stable.  She has mild ongoing fatigue but denies any drenching night sweats.  Denies any lumps or bumps anywhere.  ECOG PS- 1  Pain scale- 0   Review of systems- Review of Systems  Constitutional: Positive for malaise/fatigue. Negative for chills, fever and weight loss.  HENT: Negative for congestion, ear discharge and nosebleeds.   Eyes: Negative for blurred vision.  Respiratory: Negative for cough, hemoptysis, sputum production, shortness of breath and wheezing.   Cardiovascular: Negative for chest pain, palpitations, orthopnea and claudication.  Gastrointestinal: Negative for abdominal pain, blood in stool, constipation, diarrhea, heartburn, melena, nausea and vomiting.  Genitourinary: Negative for dysuria, flank pain, frequency, hematuria and urgency.  Musculoskeletal: Negative for back pain, joint pain and myalgias.  Skin: Negative for rash.  Neurological: Negative for dizziness, tingling, focal weakness, seizures, weakness and headaches.  Endo/Heme/Allergies: Does not bruise/bleed easily.  Psychiatric/Behavioral:  Negative for depression and suicidal ideas. The patient does not have insomnia.     No Known Allergies  Patient Active Problem List   Diagnosis Date Noted  . Special screening for malignant neoplasms, colon   . Benign neoplasm of transverse colon   . Polyp of sigmoid colon      Past Medical History:  Diagnosis Date  . Arthritis    legs, back, shoulders  . GERD (gastroesophageal reflux disease)   . Hypercholesteremia   . Hypertension   . Lymphocytosis   . Wears dentures    partial upper and lower     Past Surgical History:  Procedure Laterality Date  . CESAREAN SECTION    . COLONOSCOPY WITH PROPOFOL N/A 08/31/2016   Procedure: COLONOSCOPY WITH PROPOFOL;  Surgeon: Lucilla Lame, MD;  Location: [facility];  Service: Endoscopy;  Laterality: N/A;  . POLYPECTOMY  08/31/2016   Procedure: POLYPECTOMY;  Surgeon: Lucilla Lame, MD;  Location: [facility];  Service: Endoscopy;;  . TOTAL HIP ARTHROPLASTY Left     Social History   Socioeconomic History  . Marital status: Single    Spouse name: Not on file  . Number of children: Not on file  . Years of education: Not on file  . Highest education level: Not on file  Occupational History  . Not on file  Tobacco Use  . Smoking status: Current Every Day Smoker    Packs/day: 0.50    Years: 50.00    Pack years: 25.00    Types: Cigarettes  . Smokeless tobacco: Never Used  Substance and Sexual Activity  . Alcohol use: Not Currently    Comment: not drank in while  . Drug use: Yes    Types: Marijuana    Comment: 2 puffs a  week  . Sexual activity: Not Currently  Other Topics Concern  . Not on file  Social History Narrative  . Not on file   Social Determinants of Health   Financial Resource Strain:   . Difficulty of Paying Living Expenses:   Food Insecurity:   . Worried About Charity fundraiser in the Last Year:   . Arboriculturist in the Last Year:   Transportation Needs:   . Film/video editor  (Medical):   Marland Kitchen Lack of Transportation (Non-Medical):   Physical Activity:   . Days of Exercise per Week:   . Minutes of Exercise per Session:   Stress:   . Feeling of Stress :   Social Connections:   . Frequency of Communication with Friends and Family:   . Frequency of Social Gatherings with Friends and Family:   . Attends Religious Services:   . Active Member of Clubs or Organizations:   . Attends Archivist Meetings:   Marland Kitchen Marital Status:   Intimate Partner Violence:   . Fear of Current or Ex-Partner:   . Emotionally Abused:   Marland Kitchen Physically Abused:   . Sexually Abused:      Family History  Problem Relation Age of Onset  . Hypertension Mother   . Diabetes Mother   . Stroke Mother   . Colon polyps Sister      Current Outpatient Medications:  .  Amlodipine-Valsartan-HCTZ 10-160-25 MG TABS, Take by mouth., Disp: , Rfl:  .  aspirin EC 81 MG tablet, Take 81 mg by mouth daily., Disp: , Rfl:  .  CLOTRIMAZOLE-BETAMETHASONE EX, Apply 1 Dose topically in the morning and at bedtime. Apply bid and it was 1%/0.05%, Disp: , Rfl:  .  famotidine (PEPCID) 20 MG tablet, Take 20 mg by mouth 2 (two) times daily., Disp: , Rfl:  .  lovastatin (MEVACOR) 40 MG tablet, Take 40 mg by mouth at bedtime., Disp: , Rfl:    Physical exam:  Vitals:   02/05/20 1127  BP: (!) 157/81  Pulse: 67  Resp: 16  Temp: 97.7 F (36.5 C)  TempSrc: Tympanic  Weight: 226 lb 11.2 oz (102.8 kg)   Physical Exam Constitutional:      General: She is not in acute distress. HENT:     Head: Normocephalic and atraumatic.  Eyes:     Pupils: Pupils are equal, round, and reactive to light.  Cardiovascular:     Rate and Rhythm: Normal rate and regular rhythm.     Heart sounds: Normal heart sounds.  Pulmonary:     Effort: Pulmonary effort is normal.     Breath sounds: Normal breath sounds.  Abdominal:     General: Bowel sounds are normal.     Palpations: Abdomen is soft.  Musculoskeletal:     Cervical  back: Normal range of motion.  Lymphadenopathy:     Comments: No palpable cervical, supraclavicular, axillary or inguinal adenopathy   Skin:    General: Skin is warm and dry.  Neurological:     Mental Status: She is alert and oriented to person, place, and time.        No flowsheet data found. CBC Latest Ref Rng & Units 02/05/2020  WBC 4.0 - 10.5 K/uL 10.2  Hemoglobin 12.0 - 15.0 g/dL 13.5  Hematocrit 36.0 - 46.0 % 40.1  Platelets 150 - 400 K/uL 342     Assessment and plan- Patient is a 69 y.o. female referred for lymphocytosis  Patient referred for  lymphocytosis with a normal white cell count.  Normal hemoglobin and platelet count.  This may be transiently elevated and can be nonspecific secondary to inflammation/chronic disease.  Today I will get a repeat CBC with differential, smear review.  If she is known to have persistent lymphocytosis I will consider getting a flow cytometry at that time.  With a normal white cell count, absence of B symptoms and palpable adenopathy or splenomegaly this is unlikely to be CLL and even if it is CLL she does not require any treatment at this time   Thank you for this kind referral and the opportunity to participate in the care of this patient   Visit Diagnosis 1. Lymphocytosis     Dr. Randa Evens, MD, MPH [facility] at [facility] [id number] 02/09/2020  9:35 AM

## 2020-02-19 ENCOUNTER — Inpatient Hospital Stay: Payer: Medicare Other | Attending: Oncology | Admitting: Oncology

## 2020-02-19 ENCOUNTER — Other Ambulatory Visit: Payer: Self-pay

## 2020-02-19 ENCOUNTER — Encounter: Payer: Self-pay | Admitting: Oncology

## 2020-02-19 VITALS — BP 152/81 | HR 68 | Temp 97.3°F | Resp 18 | Wt 224.7 lb

## 2020-02-19 DIAGNOSIS — K219 Gastro-esophageal reflux disease without esophagitis: Secondary | ICD-10-CM | POA: Diagnosis not present

## 2020-02-19 DIAGNOSIS — M129 Arthropathy, unspecified: Secondary | ICD-10-CM | POA: Insufficient documentation

## 2020-02-19 DIAGNOSIS — R5383 Other fatigue: Secondary | ICD-10-CM | POA: Diagnosis not present

## 2020-02-19 DIAGNOSIS — Z7982 Long term (current) use of aspirin: Secondary | ICD-10-CM | POA: Insufficient documentation

## 2020-02-19 DIAGNOSIS — D72820 Lymphocytosis (symptomatic): Secondary | ICD-10-CM | POA: Insufficient documentation

## 2020-02-19 DIAGNOSIS — E785 Hyperlipidemia, unspecified: Secondary | ICD-10-CM | POA: Insufficient documentation

## 2020-02-19 DIAGNOSIS — I10 Essential (primary) hypertension: Secondary | ICD-10-CM | POA: Insufficient documentation

## 2020-02-19 DIAGNOSIS — F17210 Nicotine dependence, cigarettes, uncomplicated: Secondary | ICD-10-CM | POA: Diagnosis not present

## 2020-02-19 DIAGNOSIS — Z79899 Other long term (current) drug therapy: Secondary | ICD-10-CM | POA: Diagnosis not present

## 2020-02-19 DIAGNOSIS — E7800 Pure hypercholesterolemia, unspecified: Secondary | ICD-10-CM | POA: Insufficient documentation

## 2020-02-22 NOTE — Progress Notes (Signed)
Hematology/Oncology Consult note [facility]  [phone] [phone]  Patient Care Team: Cletis Athens, MD as PCP - General (Internal Medicine)   Name of the patient: Samantha Payne  [id number]  08-01-1951   Date of visit: 02/22/20  Diagnosis- lymphocytosis - resolved likely reactive  Chief complaint/ Reason for visit- discuss results of biopsy and further management  Heme/Onc history: Patient is a 69 year old African-American female with a past medical history significant for hypertension and GERD and hyperlipidemia among other medical problems.  She has been referred to [place] for lymphocytosis.  Her most recent labs done on 01/11/2020 showed a white cell count of 10.6, H&H of 13.9/42.3 and a platelet count of 390.  There was mild absolute lymphocytosis noted with a count of 4049. Results of blood work from 02/05/2020 showed white cell count of 10.2, H&H of 13.5/40.1 and a platelet count of 342.  Absolute lymphocyte count was normal at 2.9 with 29% lymphocytes.  Smear review was unremarkable  Interval history-patient reports mild chronic fatigue but denies other complaints  ECOG PS- 1 Pain scale- 0  Review of systems- Review of Systems  Constitutional: Positive for malaise/fatigue. Negative for chills, fever and weight loss.  HENT: Negative for congestion, ear discharge and nosebleeds.   Eyes: Negative for blurred vision.  Respiratory: Negative for cough, hemoptysis, sputum production, shortness of breath and wheezing.   Cardiovascular: Negative for chest pain, palpitations, orthopnea and claudication.  Gastrointestinal: Negative for abdominal pain, blood in stool, constipation, diarrhea, heartburn, melena, nausea and vomiting.  Genitourinary: Negative for dysuria, flank pain, frequency, hematuria and urgency.  Musculoskeletal: Negative for back pain, joint pain and myalgias.  Skin: Negative for rash.  Neurological: Negative for dizziness,  tingling, focal weakness, seizures, weakness and headaches.  Endo/Heme/Allergies: Does not bruise/bleed easily.  Psychiatric/Behavioral: Negative for depression and suicidal ideas. The patient does not have insomnia.       Allergies  Allergen Reactions  . Ace Inhibitors     Swelling, ? angioedema  . Penicillins     Other reaction(s): ITCHING     Past Medical History:  Diagnosis Date  . Arthritis    legs, back, shoulders  . GERD (gastroesophageal reflux disease)   . Hypercholesteremia   . Hypertension   . Lymphocytosis   . Wears dentures    partial upper and lower     Past Surgical History:  Procedure Laterality Date  . CESAREAN SECTION    . COLONOSCOPY WITH PROPOFOL N/A 08/31/2016   Procedure: COLONOSCOPY WITH PROPOFOL;  Surgeon: Lucilla Lame, MD;  Location: [place];  Service: Endoscopy;  Laterality: N/A;  . POLYPECTOMY  08/31/2016   Procedure: POLYPECTOMY;  Surgeon: Lucilla Lame, MD;  Location: [place];  Service: Endoscopy;;  . TOTAL HIP ARTHROPLASTY Left     Social History   Socioeconomic History  . Marital status: Single    Spouse name: Not on file  . Number of children: Not on file  . Years of education: Not on file  . Highest education level: Not on file  Occupational History  . Not on file  Tobacco Use  . Smoking status: Current Every Day Smoker    Packs/day: 0.50    Years: 50.00    Pack years: 25.00    Types: Cigarettes  . Smokeless tobacco: Never Used  Substance and Sexual Activity  . Alcohol use: Not Currently    Comment: not drank in while  . Drug use: Yes    Types:  Marijuana    Comment: 2 puffs a week  . Sexual activity: Not Currently  Other Topics Concern  . Not on file  Social History Narrative  . Not on file   Social Determinants of Health   Financial Resource Strain:   . Difficulty of Paying Living Expenses:   Food Insecurity:   . Worried About Charity fundraiser in the Last Year:   . Arboriculturist in the  Last Year:   Transportation Needs:   . Film/video editor (Medical):   Marland Kitchen Lack of Transportation (Non-Medical):   Physical Activity:   . Days of Exercise per Week:   . Minutes of Exercise per Session:   Stress:   . Feeling of Stress :   Social Connections:   . Frequency of Communication with Friends and Family:   . Frequency of Social Gatherings with Friends and Family:   . Attends Religious Services:   . Active Member of Clubs or Organizations:   . Attends Archivist Meetings:   Marland Kitchen Marital Status:   Intimate Partner Violence:   . Fear of Current or Ex-Partner:   . Emotionally Abused:   Marland Kitchen Physically Abused:   . Sexually Abused:     Family History  Problem Relation Age of Onset  . Hypertension Mother   . Diabetes Mother   . Stroke Mother   . Colon polyps Sister      Current Outpatient Medications:  .  amLODipine (NORVASC) 10 MG tablet, Take 10 mg by mouth daily., Disp: , Rfl:  .  aspirin EC 81 MG tablet, Take 81 mg by mouth daily., Disp: , Rfl:  .  CLOTRIMAZOLE-BETAMETHASONE EX, Apply 1 Dose topically in the morning and at bedtime. Apply bid and it was 1%/0.05%, Disp: , Rfl:  .  famotidine (PEPCID) 20 MG tablet, Take 20 mg by mouth 2 (two) times daily., Disp: , Rfl:  .  lovastatin (MEVACOR) 40 MG tablet, Take 40 mg by mouth at bedtime., Disp: , Rfl:  .  valsartan-hydrochlorothiazide (DIOVAN-HCT) 160-25 MG tablet, Take 1 tablet by mouth daily., Disp: , Rfl:   Physical exam:  Vitals:   02/19/20 1010  BP: (!) 152/81  Pulse: 68  Resp: 18  Temp: (!) 97.3 F (36.3 C)  TempSrc: Tympanic  SpO2: 98%  Weight: 224 lb 11.2 oz (101.9 kg)   Physical Exam Constitutional:      General: She is not in acute distress. Cardiovascular:     Rate and Rhythm: Normal rate and regular rhythm.     Heart sounds: Normal heart sounds.  Pulmonary:     Effort: Pulmonary effort is normal.     Breath sounds: Normal breath sounds.  Skin:    General: Skin is warm and dry.    Neurological:     Mental Status: She is alert and oriented to person, place, and time.      No flowsheet data found. CBC Latest Ref Rng & Units 02/05/2020  WBC 4.0 - 10.5 K/uL 10.2  Hemoglobin 12.0 - 15.0 g/dL 13.5  Hematocrit 36.0 - 46.0 % 40.1  Platelets 150 - 400 K/uL 342      Assessment and plan- Patient is a 69 y.o. female referred for lymphocytosis with a normal white cell count  Patient was noted to have mild lymphocytosis based on her lab work with primary care doctor.  We will repeat her CBC with differential her white cell count was normal with a normal absolute lymphocyte count.  No  evidence of other cytopenias such as anemia or thrombocytopenia.  Smear review was unremarkable.  I will check her CBC with differential in 6 months in 1 year and if it continues to be normal at that time she can continue to follow-up with her primary care doctor for this   Visit Diagnosis 1. Lymphocytosis      Dr. Randa Evens, MD, MPH [facility] [id number] 02/22/2020 12:58 PM

## 2020-02-23 ENCOUNTER — Ambulatory Visit: Payer: Medicare Other | Attending: Internal Medicine

## 2020-02-23 DIAGNOSIS — Z23 Encounter for immunization: Secondary | ICD-10-CM

## 2020-02-23 NOTE — Progress Notes (Signed)
   Covid-19 Vaccination Clinic  Name:  Samantha Payne    MRN: [id number] DOB: Jul 09, 1951  02/23/2020  Samantha Payne was observed post Covid-19 immunization for 15 minutes without incident. She was provided with Vaccine Information Sheet and instruction to access the V-Safe system.   Samantha Payne was instructed to call 911 with any severe reactions post vaccine: Marland Kitchen Difficulty breathing  . Swelling of face and throat  . A fast heartbeat  . A bad rash all over body  . Dizziness and weakness   Immunizations Administered    Name Date Dose VIS Date Route   Moderna COVID-19 Vaccine 02/23/2020  9:30 AM 0.5 mL 10/13/2019 Intramuscular   Manufacturer: Moderna   LotMV:[id number]   [id number]

## 2020-04-21 ENCOUNTER — Other Ambulatory Visit: Payer: Self-pay

## 2020-04-21 ENCOUNTER — Ambulatory Visit (INDEPENDENT_AMBULATORY_CARE_PROVIDER_SITE_OTHER): Payer: Medicare Other | Admitting: Internal Medicine

## 2020-04-21 ENCOUNTER — Encounter: Payer: Self-pay | Admitting: Internal Medicine

## 2020-04-21 VITALS — BP 116/65 | HR 73 | Ht 63.0 in | Wt 233.8 lb

## 2020-04-21 DIAGNOSIS — M19079 Primary osteoarthritis, unspecified ankle and foot: Secondary | ICD-10-CM | POA: Insufficient documentation

## 2020-04-21 DIAGNOSIS — E669 Obesity, unspecified: Secondary | ICD-10-CM

## 2020-04-21 DIAGNOSIS — I998 Other disorder of circulatory system: Secondary | ICD-10-CM

## 2020-04-21 NOTE — Assessment & Plan Note (Signed)
Patient was advised to lose weight 

## 2020-04-21 NOTE — Progress Notes (Signed)
Established Patient Office Visit  SUBJECTIVE:  Patient ID: Samantha Payne, female    DOB: 19-Aug-1951  Age: 69 y.o. MRN: [id number]  CC:  Chief Complaint  Patient presents with  . Hypertension    patient here today to follow up on Blood pressure     HPI Samantha Payne presents for dizziness without syncope. She denies falls. She notes that every time she stands up she feels dizzy. She says when she walks, everything in the room is spinning around. She does not have any symptoms with sitting. She notes that she was taking hydrochlorothiazide before the transferred her care to [facility]. She is now taking Norvasc.   She does have a history of smoking, around half a pack per day.   She notes that she does drink some water, but she is likely not drinking enough water per day for the amound of medication with the fluid pill she is taking. Today, she has only had 1.5 cups of water.   Past Medical History:  Diagnosis Date  . Arthritis    legs, back, shoulders  . GERD (gastroesophageal reflux disease)   . Hypercholesteremia   . Hypertension   . Lymphocytosis   . Wears dentures    partial upper and lower    Past Surgical History:  Procedure Laterality Date  . CESAREAN SECTION    . COLONOSCOPY WITH PROPOFOL N/A 08/31/2016   Procedure: COLONOSCOPY WITH PROPOFOL;  Surgeon: Lucilla Lame, MD;  Location: [place];  Service: Endoscopy;  Laterality: N/A;  . POLYPECTOMY  08/31/2016   Procedure: POLYPECTOMY;  Surgeon: Lucilla Lame, MD;  Location: [place];  Service: Endoscopy;;  . TOTAL HIP ARTHROPLASTY Left     Family History  Problem Relation Age of Onset  . Hypertension Mother   . Diabetes Mother   . Stroke Mother   . Colon polyps Sister     Social History   Socioeconomic History  . Marital status: Single    Spouse name: Not on file  . Number of children: Not on file  . Years of education: Not on file  . Highest education level: Not on file    Occupational History  . Not on file  Tobacco Use  . Smoking status: Current Every Day Smoker    Packs/day: 0.50    Years: 50.00    Pack years: 25.00    Types: Cigarettes  . Smokeless tobacco: Never Used  Vaping Use  . Vaping Use: Never used  Substance and Sexual Activity  . Alcohol use: Not Currently    Comment: not drank in while  . Drug use: Yes    Types: Marijuana    Comment: 2 puffs a week  . Sexual activity: Not Currently  Other Topics Concern  . Not on file  Social History Narrative  . Not on file   Social Determinants of Health   Financial Resource Strain:   . Difficulty of Paying Living Expenses:   Food Insecurity:   . Worried About Charity fundraiser in the Last Year:   . Arboriculturist in the Last Year:   Transportation Needs:   . Film/video editor (Medical):   Marland Kitchen Lack of Transportation (Non-Medical):   Physical Activity:   . Days of Exercise per Week:   . Minutes of Exercise per Session:   Stress:   . Feeling of Stress :   Social Connections:   . Frequency of Communication with Friends and Family:   .  Frequency of Social Gatherings with Friends and Family:   . Attends Religious Services:   . Active Member of Clubs or Organizations:   . Attends Archivist Meetings:   Marland Kitchen Marital Status:   Intimate Partner Violence:   . Fear of Current or Ex-Partner:   . Emotionally Abused:   Marland Kitchen Physically Abused:   . Sexually Abused:      Current Outpatient Medications:  .  aspirin EC 81 MG tablet, Take 81 mg by mouth daily., Disp: , Rfl:  .  famotidine (PEPCID) 20 MG tablet, Take 20 mg by mouth 2 (two) times daily., Disp: , Rfl:  .  lovastatin (MEVACOR) 40 MG tablet, Take 40 mg by mouth at bedtime., Disp: , Rfl:  .  valsartan-hydrochlorothiazide (DIOVAN-HCT) 160-25 MG tablet, Take 1 tablet by mouth daily., Disp: , Rfl:    Allergies  Allergen Reactions  . Ace Inhibitors     Swelling, ? angioedema  . Penicillins     Other reaction(s): ITCHING     ROS Review of Systems  Constitutional: Negative.   HENT: Negative.   Eyes: Negative.   Respiratory: Negative.   Cardiovascular: Negative.   Gastrointestinal: Negative.   Endocrine: Negative.   Genitourinary: Negative.   Musculoskeletal: Negative.   Neurological: Positive for dizziness (with standing and walking), light-headedness and headaches (occasional). Negative for syncope, facial asymmetry and weakness.  Psychiatric/Behavioral: Negative for confusion.  All other systems reviewed and are negative.     OBJECTIVE:    Physical Exam Vitals reviewed.  Constitutional:      Appearance: Normal appearance. She is obese.  Cardiovascular:     Rate and Rhythm: Normal rate.     Pulses: Normal pulses.     Heart sounds: Normal heart sounds.  Pulmonary:     Effort: Pulmonary effort is normal.     Breath sounds: Normal breath sounds.  Musculoskeletal:     Right ankle:     Right Achilles Tendon: Tenderness (Bursitis) present.  Neurological:     General: No focal deficit present.     Mental Status: She is alert.     Cranial Nerves: No facial asymmetry.     Motor: No weakness, tremor or seizure activity.  Psychiatric:        Mood and Affect: Mood normal.        Behavior: Behavior normal.    BP 116/65 (BP Location: Left Arm, Cuff Size: Large)   Pulse 73   Ht 5' 3"  (1.6 m)   Wt 233 lb 12.8 oz (106.1 kg)   BMI 41.42 kg/m    Wt Readings from Last 3 Encounters:  04/21/20 233 lb 12.8 oz (106.1 kg)  02/19/20 224 lb 11.2 oz (101.9 kg)  02/05/20 226 lb 11.2 oz (102.8 kg)     Health Maintenance Due  Topic Date Due  . Hepatitis C Screening  Never done  . DEXA SCAN  Never done  . PNA vac Low Risk Adult (1 of 2 - PCV13) Never done  . MAMMOGRAM  03/18/2020    There are no preventive care reminders to display for this patient.  No results found for: TSH Lab Results  Component Value Date   WBC 10.2 02/05/2020   HGB 13.5 02/05/2020   HCT 40.1 02/05/2020   MCV 82.3  02/05/2020   PLT 342 02/05/2020   No results found for: NA, K, CHLORIDE, CO2, GLUCOSE, BUN, CREATININE, BILITOT, ALKPHOS, AST, ALT, PROT, ALBUMIN, CALCIUM, ANIONGAP, EGFR, GFR No results found for: CHOL No results  found for: HDL No results found for: LDLCALC No results found for: TRIG No results found for: CHOLHDL No results found for: HGBA1C    ASSESSMENT & PLAN:   Problem List Items Addressed This Visit      Musculoskeletal and Integument   Arthritis of ankle    Bursitis of the left ankle was noted.        Other   Obesity (BMI 30-39.9)    Patient was advised to lose weight.      Blood pressure instability - Primary    Blood pressure is labile at the present time.  I have reduced  Valsarton  to half a tablet p.o. daily and  stop the amlodipine.  Encouraged to drink more fluid to 4 to 6 glasses a day      Relevant Orders   CBC with Differential/Platelet   Electrolyte panel   EKG 12-Lead     Stop Amlodipine Reduce from one Valsartan-HCTZ 160-97m tab P.O to 1/2 Valsartan-HCTZ 160-249mtab P.O ECG today  Shows poor  R wave  v1 v2 and v3/ no acute changes  1. Blood pressure instability Blood unstable because of dehydration and use of diuretic in her blood pressure medication.  Has evidence of postural hypotension.   - CBC with Differential/Platelet - Electrolyte panel - EKG 12-Lead       Electrocardiogram revealed normal sinus rhythm no acute changes were noted.  There is poor R wave progression in V1 V2 and V3  2. Obesity (BMI 30-39.9) Patient was encouraged to lose weight  3. Arthritis of ankle She has minimal bursitis of the left ankle it is related to her weight.  She can take Tylenol for the pain. Follow-up: Return in about 1 week (around 04/28/2020).   By signing my name below, I, AmGeneral Dynamicsattest that this documentation has been prepared under the direction and in the presence of MaCletis AthensMD.   I personally performed the services described in this  documentation, which was SCRIBED in my presence. The recorded information has been reviewed and considered accurate. It has been edited as necessary during review. JaCletis AthensMD

## 2020-04-21 NOTE — Assessment & Plan Note (Signed)
Bursitis of the left ankle was noted.

## 2020-04-21 NOTE — Assessment & Plan Note (Signed)
Blood pressure is labile at the present time.  I have reduced  Valsarton  to half a tablet p.o. daily and  stop the amlodipine.  Encouraged to drink more fluid to 4 to 6 glasses a day

## 2020-04-23 LAB — CBC WITH DIFFERENTIAL/PLATELET
Absolute Monocytes: 594 cells/uL (ref 200–950)
Basophils Absolute: 40 cells/uL (ref 0–200)
Basophils Relative: 0.4 %
Eosinophils Absolute: 69 cells/uL (ref 15–500)
Eosinophils Relative: 0.7 %
HCT: 45 % (ref 35.0–45.0)
Hemoglobin: 14.6 g/dL (ref 11.7–15.5)
Lymphs Abs: 3010 cells/uL (ref 850–3900)
MCH: 27.8 pg (ref 27.0–33.0)
MCHC: 32.4 g/dL (ref 32.0–36.0)
MCV: 85.7 fL (ref 80.0–100.0)
MPV: 10.1 fL (ref 7.5–12.5)
Monocytes Relative: 6 %
Neutro Abs: 6188 cells/uL (ref 1500–7800)
Neutrophils Relative %: 62.5 %
Platelets: 370 10*3/uL (ref 140–400)
RBC: 5.25 10*6/uL — ABNORMAL HIGH (ref 3.80–5.10)
RDW: 14.2 % (ref 11.0–15.0)
Total Lymphocyte: 30.4 %
WBC: 9.9 10*3/uL (ref 3.8–10.8)

## 2020-04-23 LAB — ELECTROLYTE PANEL
CO2: 22 mmol/L (ref 20–32)
Chloride: 103 mmol/L (ref 98–110)
Potassium: 3.7 mmol/L (ref 3.5–5.3)
Sodium: 139 mmol/L (ref 135–146)

## 2020-04-28 ENCOUNTER — Ambulatory Visit (INDEPENDENT_AMBULATORY_CARE_PROVIDER_SITE_OTHER): Payer: Medicare Other | Admitting: Internal Medicine

## 2020-04-28 ENCOUNTER — Encounter: Payer: Self-pay | Admitting: Internal Medicine

## 2020-04-28 ENCOUNTER — Other Ambulatory Visit: Payer: Self-pay

## 2020-04-28 VITALS — BP 154/80 | HR 72 | Ht 63.0 in | Wt 225.7 lb

## 2020-04-28 DIAGNOSIS — I998 Other disorder of circulatory system: Secondary | ICD-10-CM

## 2020-04-28 DIAGNOSIS — E669 Obesity, unspecified: Secondary | ICD-10-CM

## 2020-04-28 DIAGNOSIS — M19079 Primary osteoarthritis, unspecified ankle and foot: Secondary | ICD-10-CM | POA: Diagnosis not present

## 2020-04-28 NOTE — Progress Notes (Signed)
Established Patient Office Visit  Subjective:  Patient ID: Samantha Payne, female    DOB: 07-26-1951  Age: 69 y.o. MRN: [id number]  CC:  Chief Complaint  Patient presents with  . Lab Results    HPI  Samantha Payne presents for general checkup.  She denies any chest pain, shortness of breath, knee pain joint pain stomach pain. leg swelling.  She is taking her medication on a regular basis.  Her blood pressure is borderline elevated , asked her to cut down salt intake.  And walk on a daily basis.  Past Medical History:  Diagnosis Date  . Arthritis    legs, back, shoulders  . GERD (gastroesophageal reflux disease)   . Hypercholesteremia   . Hypertension   . Lymphocytosis   . Wears dentures    partial upper and lower    Past Surgical History:  Procedure Laterality Date  . CESAREAN SECTION    . COLONOSCOPY WITH PROPOFOL N/A 08/31/2016   Procedure: COLONOSCOPY WITH PROPOFOL;  Surgeon: Lucilla Lame, MD;  Location: [place];  Service: Endoscopy;  Laterality: N/A;  . POLYPECTOMY  08/31/2016   Procedure: POLYPECTOMY;  Surgeon: Lucilla Lame, MD;  Location: [place];  Service: Endoscopy;;  . TOTAL HIP ARTHROPLASTY Left     Family History  Problem Relation Age of Onset  . Hypertension Mother   . Diabetes Mother   . Stroke Mother   . Colon polyps Sister     Social History   Socioeconomic History  . Marital status: Single    Spouse name: Not on file  . Number of children: Not on file  . Years of education: Not on file  . Highest education level: Not on file  Occupational History  . Not on file  Tobacco Use  . Smoking status: Current Every Day Smoker    Packs/day: 0.50    Years: 50.00    Pack years: 25.00    Types: Cigarettes  . Smokeless tobacco: Never Used  Vaping Use  . Vaping Use: Never used  Substance and Sexual Activity  . Alcohol use: Not Currently    Comment: not drank in while  . Drug use: Yes    Types: Marijuana    Comment: 2 puffs  a week  . Sexual activity: Not Currently  Other Topics Concern  . Not on file  Social History Narrative  . Not on file   Social Determinants of Health   Financial Resource Strain:   . Difficulty of Paying Living Expenses:   Food Insecurity:   . Worried About Charity fundraiser in the Last Year:   . Arboriculturist in the Last Year:   Transportation Needs:   . Film/video editor (Medical):   Marland Kitchen Lack of Transportation (Non-Medical):   Physical Activity:   . Days of Exercise per Week:   . Minutes of Exercise per Session:   Stress:   . Feeling of Stress :   Social Connections:   . Frequency of Communication with Friends and Family:   . Frequency of Social Gatherings with Friends and Family:   . Attends Religious Services:   . Active Member of Clubs or Organizations:   . Attends Archivist Meetings:   Marland Kitchen Marital Status:   Intimate Partner Violence:   . Fear of Current or Ex-Partner:   . Emotionally Abused:   Marland Kitchen Physically Abused:   . Sexually Abused:      Current Outpatient Medications:  .  aspirin EC 81 MG tablet, Take 81 mg by mouth daily., Disp: , Rfl:  .  famotidine (PEPCID) 20 MG tablet, Take 20 mg by mouth 2 (two) times daily., Disp: , Rfl:  .  lovastatin (MEVACOR) 40 MG tablet, Take 40 mg by mouth at bedtime., Disp: , Rfl:  .  valsartan-hydrochlorothiazide (DIOVAN-HCT) 160-25 MG tablet, Take 1 tablet by mouth daily., Disp: , Rfl:    Allergies  Allergen Reactions  . Ace Inhibitors     Swelling, ? angioedema  . Penicillins     Other reaction(s): ITCHING    ROS Review of Systems  Review of Systems  Constitutional: Negative for chills, diaphoresis, fever and malaise/fatigue.  HENT: Negative for congestion, ear pain and sore throat.   Respiratory: Negative for cough, shortness of breath, wheezing Cardiovascular: Negative for chest pain, palpitations, peripheral edema, orthopnea, PND Gastrointestinal: Negative for abdominal pain, constipation,  diarrhea, nausea and vomiting.  Genitourinary: Negative for dysuria, incontinence, hematuria  Musculoskeletal: Negative for myalgias. Negative for arthralgias. Skin: Negative for rashes or pruritus Neurological: Negative for dizziness, syncope, headaches, focal weakness, altered sensation  Psychiatric/Behavioral: Negative for depression and anxiety.        Objective:    Physical Exam   Constitutional: The patient is oriented to person, place, and time. Pt appears well-developed and well-nourished.  Head: Normocephalic and atraumatic.  Eyes: Pupils are equal, round, and reactive to light.  Neck: No JVD present. No tracheal deviation present. No thyromegaly present.  Cardiovascular: Regular rate and rhythm. No gallop. Pulmonary/Chest: Normal breath sounds. Lungs clear to auscultation. Abdominal: No abdominal tenderness. No guarding or rebound tenderness. No hepatosplenomegaly. Musculoskeletal: Normal range of motion.  Lymphatic: No cervical adenopathy.  Neurological: No cranial nerve deficit.  Skin: Skin is warm and hydrated.  Psychiatric: The patient has a normal mood and affect.   BP (!) 154/80   Pulse 72   Ht 5\' 3"  (1.6 m)   Wt 225 lb 11.2 oz (102.4 kg)   BMI 39.98 kg/m  Wt Readings from Last 3 Encounters:  04/28/20 225 lb 11.2 oz (102.4 kg)  04/21/20 233 lb 12.8 oz (106.1 kg)  02/19/20 224 lb 11.2 oz (101.9 kg)     Health Maintenance Due  Topic Date Due  . Hepatitis C Screening  Never done  . DEXA SCAN  Never done  . PNA vac Low Risk Adult (1 of 2 - PCV13) Never done  . MAMMOGRAM  03/18/2020    There are no preventive care reminders to display for this patient.  No results found for: TSH Lab Results  Component Value Date   WBC 9.9 04/21/2020   HGB 14.6 04/21/2020   HCT 45.0 04/21/2020   MCV 85.7 04/21/2020   PLT 370 04/21/2020   Lab Results  Component Value Date   NA 139 04/21/2020   K 3.7 04/21/2020   CO2 22 04/21/2020   No results found for:  CHOL No results found for: HDL No results found for: LDLCALC No results found for: TRIG No results found for: CHOLHDL No results found for: HGBA1C    Assessment & Plan:   Problem List Items Addressed This Visit      Musculoskeletal and Integument   Arthritis of ankle    Patient was advised to lose weight and if he gets worse we will send her to orthopedic        Other   Obesity (BMI 30-39.9) - Primary    - I encouraged the patient to lose weight. I  educated heron making healthy dietary choices including eating more fruits and vegetables and less fried foods. - I encouraged the patient to exercise more, and educated her on the benefits of exercise including weight loss, diabetes prevention/management, and hypertension prevention/management.          Blood pressure instability    Lab tests were reviewed with the patient.  Was advised to take her blood pressure medicine on a regular basis lose weight at least a mile daily          No orders of the defined types were placed in this encounter.  1. Obesity (BMI 30-39.9) Lose weight  2. Blood pressure instability Daily    If  Get worse we will send her to Ortho Follow-up: Return in about 3 months (around 07/29/2020).    Cletis Athens, MD

## 2020-04-28 NOTE — Assessment & Plan Note (Signed)
-   I encouraged the patient to lose weight. I educated them on making healthy dietary choices including eating more fruits and vegetables and less fried foods. - I encouraged the patient to exercise more, and educated them on the benefits of exercise including weight loss, diabetes prevention/management, and hypertension prevention/management.     

## 2020-04-28 NOTE — Assessment & Plan Note (Signed)
Lab tests were reviewed with the patient.  Was advised to take her blood pressure medicine on a regular basis lose weight at least a mile daily

## 2020-04-28 NOTE — Assessment & Plan Note (Signed)
Patient was advised to lose weight and if he gets worse we will send her to orthopedic

## 2020-07-29 ENCOUNTER — Ambulatory Visit: Payer: Medicare Other | Admitting: Internal Medicine

## 2020-08-04 ENCOUNTER — Other Ambulatory Visit: Payer: Self-pay | Admitting: Internal Medicine

## 2020-08-04 DIAGNOSIS — E662 Morbid (severe) obesity with alveolar hypoventilation: Secondary | ICD-10-CM

## 2020-08-19 ENCOUNTER — Inpatient Hospital Stay: Payer: Medicare Other | Attending: Oncology

## 2020-08-19 ENCOUNTER — Other Ambulatory Visit: Payer: Self-pay

## 2020-08-19 ENCOUNTER — Encounter (INDEPENDENT_AMBULATORY_CARE_PROVIDER_SITE_OTHER): Payer: Self-pay

## 2020-08-19 DIAGNOSIS — D72829 Elevated white blood cell count, unspecified: Secondary | ICD-10-CM | POA: Insufficient documentation

## 2020-08-19 DIAGNOSIS — D72820 Lymphocytosis (symptomatic): Secondary | ICD-10-CM

## 2020-08-19 LAB — CBC WITH DIFFERENTIAL/PLATELET
Abs Immature Granulocytes: 0.04 10*3/uL (ref 0.00–0.07)
Basophils Absolute: 0 10*3/uL (ref 0.0–0.1)
Basophils Relative: 0 %
Eosinophils Absolute: 0.1 10*3/uL (ref 0.0–0.5)
Eosinophils Relative: 1 %
HCT: 41 % (ref 36.0–46.0)
Hemoglobin: 14.3 g/dL (ref 12.0–15.0)
Immature Granulocytes: 0 %
Lymphocytes Relative: 37 %
Lymphs Abs: 3.9 10*3/uL (ref 0.7–4.0)
MCH: 28.3 pg (ref 26.0–34.0)
MCHC: 34.9 g/dL (ref 30.0–36.0)
MCV: 81.2 fL (ref 80.0–100.0)
Monocytes Absolute: 0.8 10*3/uL (ref 0.1–1.0)
Monocytes Relative: 7 %
Neutro Abs: 5.6 10*3/uL (ref 1.7–7.7)
Neutrophils Relative %: 55 %
Platelets: 344 10*3/uL (ref 150–400)
RBC: 5.05 MIL/uL (ref 3.87–5.11)
RDW: 14.8 % (ref 11.5–15.5)
WBC: 10.4 10*3/uL (ref 4.0–10.5)
nRBC: 0 % (ref 0.0–0.2)

## 2020-08-24 ENCOUNTER — Other Ambulatory Visit: Payer: Self-pay | Admitting: Internal Medicine

## 2020-08-24 DIAGNOSIS — Z1231 Encounter for screening mammogram for malignant neoplasm of breast: Secondary | ICD-10-CM

## 2020-09-27 ENCOUNTER — Other Ambulatory Visit: Payer: Self-pay

## 2020-09-27 ENCOUNTER — Ambulatory Visit
Admission: RE | Admit: 2020-09-27 | Discharge: 2020-09-27 | Disposition: A | Discharge status: Home / Self Care | Payer: Medicare Other | Source: Ambulatory Visit | Attending: Internal Medicine | Admitting: Internal Medicine

## 2020-09-27 DIAGNOSIS — Z1231 Encounter for screening mammogram for malignant neoplasm of breast: Secondary | ICD-10-CM | POA: Diagnosis not present

## 2020-11-15 ENCOUNTER — Ambulatory Visit: Payer: Medicare Other | Admitting: Internal Medicine

## 2020-11-15 ENCOUNTER — Ambulatory Visit (INDEPENDENT_AMBULATORY_CARE_PROVIDER_SITE_OTHER): Payer: Medicare Other

## 2020-11-15 ENCOUNTER — Encounter: Payer: Self-pay | Admitting: Internal Medicine

## 2020-11-15 ENCOUNTER — Other Ambulatory Visit: Payer: Self-pay

## 2020-11-15 VITALS — BP 182/84 | HR 69 | Ht 63.0 in | Wt 214.7 lb

## 2020-11-15 DIAGNOSIS — Z23 Encounter for immunization: Secondary | ICD-10-CM

## 2021-02-07 ENCOUNTER — Other Ambulatory Visit: Payer: Self-pay | Admitting: Internal Medicine

## 2021-02-19 ENCOUNTER — Other Ambulatory Visit: Payer: Self-pay | Admitting: *Deleted

## 2021-02-19 DIAGNOSIS — D72820 Lymphocytosis (symptomatic): Secondary | ICD-10-CM

## 2021-02-20 ENCOUNTER — Encounter: Payer: Self-pay | Admitting: Oncology

## 2021-02-20 ENCOUNTER — Inpatient Hospital Stay (HOSPITAL_BASED_OUTPATIENT_CLINIC_OR_DEPARTMENT_OTHER): Payer: Medicare Other | Admitting: Oncology

## 2021-02-20 ENCOUNTER — Inpatient Hospital Stay: Payer: Medicare Other | Attending: Oncology

## 2021-02-20 VITALS — BP 147/72 | HR 80 | Temp 96.5°F | Resp 20 | Wt 199.6 lb

## 2021-02-20 DIAGNOSIS — D72820 Lymphocytosis (symptomatic): Secondary | ICD-10-CM

## 2021-02-20 DIAGNOSIS — R634 Abnormal weight loss: Secondary | ICD-10-CM | POA: Diagnosis not present

## 2021-02-20 DIAGNOSIS — F17210 Nicotine dependence, cigarettes, uncomplicated: Secondary | ICD-10-CM | POA: Insufficient documentation

## 2021-02-20 LAB — CBC WITH DIFFERENTIAL/PLATELET
Abs Immature Granulocytes: 0.02 10*3/uL (ref 0.00–0.07)
Basophils Absolute: 0 10*3/uL (ref 0.0–0.1)
Basophils Relative: 1 %
Eosinophils Absolute: 0 10*3/uL (ref 0.0–0.5)
Eosinophils Relative: 1 %
HCT: 40.7 % (ref 36.0–46.0)
Hemoglobin: 13.7 g/dL (ref 12.0–15.0)
Immature Granulocytes: 0 %
Lymphocytes Relative: 26 %
Lymphs Abs: 2.2 10*3/uL (ref 0.7–4.0)
MCH: 28.2 pg (ref 26.0–34.0)
MCHC: 33.7 g/dL (ref 30.0–36.0)
MCV: 83.7 fL (ref 80.0–100.0)
Monocytes Absolute: 0.6 10*3/uL (ref 0.1–1.0)
Monocytes Relative: 7 %
Neutro Abs: 5.5 10*3/uL (ref 1.7–7.7)
Neutrophils Relative %: 65 %
Platelets: 307 10*3/uL (ref 150–400)
RBC: 4.86 MIL/uL (ref 3.87–5.11)
RDW: 14.5 % (ref 11.5–15.5)
WBC: 8.3 10*3/uL (ref 4.0–10.5)
nRBC: 0 % (ref 0.0–0.2)

## 2021-02-20 NOTE — Progress Notes (Signed)
Hematology/Oncology Consult note [facility]  Telephone:([phone] [phone]  Patient Care Team: Cletis Athens, MD as PCP - General (Internal Medicine)   Name of the patient: Samantha Payne  [id number]  [birth year]   Date of visit: 02/20/21  Diagnosis-lymphocytosis now resolved  Chief complaint/ Reason for visit-routine follow-up of lymphocytosis  Heme/Onc history: Patient is a 70 year old African-American female with a past medical history significant for hypertension and GERD and hyperlipidemia among other medical problems. She has been referred to [place] for lymphocytosis. Her most recent labs done on 01/11/2020 showed a white cell count of 10.6, H&H of 13.9/42.3 and a platelet count of 390. There was mild absolute lymphocytosis noted with a count of 4049. Results of blood work from 02/05/2020 showed white cell count of 10.2, H&H of 13.5/40.1 and a platelet count of 342.  Absolute lymphocyte count was normal at 2.9 with 29% lymphocytes.  Smear review was unremarkable   Interval history-patient has lost 25 pounds of weight over the last 1 year.  She follows up with Dr. Rebecka Apley for this and recently underwent a change in her blood pressure medications.  Reports ongoing fatigue.  Denies other complaints at this time  ECOG PS- 1 Pain scale- 0   Review of systems- Review of Systems  Constitutional: Positive for malaise/fatigue. Negative for chills, fever and weight loss.  HENT: Negative for congestion, ear discharge and nosebleeds.   Eyes: Negative for blurred vision.  Respiratory: Negative for cough, hemoptysis, sputum production, shortness of breath and wheezing.   Cardiovascular: Negative for chest pain, palpitations, orthopnea and claudication.  Gastrointestinal: Negative for abdominal pain, blood in stool, constipation, diarrhea, heartburn, melena, nausea and vomiting.  Genitourinary: Negative for dysuria, flank pain, frequency, hematuria and  urgency.  Musculoskeletal: Negative for back pain, joint pain and myalgias.  Skin: Negative for rash.  Neurological: Negative for dizziness, tingling, focal weakness, seizures, weakness and headaches.  Endo/Heme/Allergies: Does not bruise/bleed easily.  Psychiatric/Behavioral: Negative for depression and suicidal ideas. The patient does not have insomnia.       Allergies  Allergen Reactions  . Ace Inhibitors     Swelling, ? angioedema  . Penicillins     Other reaction(s): ITCHING     Past Medical History:  Diagnosis Date  . Arthritis    legs, back, shoulders  . GERD (gastroesophageal reflux disease)   . Hypercholesteremia   . Hypertension   . Lymphocytosis   . Wears dentures    partial upper and lower     Past Surgical History:  Procedure Laterality Date  . CESAREAN SECTION    . COLONOSCOPY WITH PROPOFOL N/A 08/31/2016   Procedure: COLONOSCOPY WITH PROPOFOL;  Surgeon: Lucilla Lame, MD;  Location: [place];  Service: Endoscopy;  Laterality: N/A;  . POLYPECTOMY  08/31/2016   Procedure: POLYPECTOMY;  Surgeon: Lucilla Lame, MD;  Location: [place];  Service: Endoscopy;;  . TOTAL HIP ARTHROPLASTY Left     Social History   Socioeconomic History  . Marital status: Single    Spouse name: Not on file  . Number of children: Not on file  . Years of education: Not on file  . Highest education level: Not on file  Occupational History  . Not on file  Tobacco Use  . Smoking status: Current Every Day Smoker    Packs/day: 0.50    Years: 50.00    Pack years: 25.00    Types: Cigarettes  . Smokeless tobacco: Never Used  Vaping Use  .  Vaping Use: Never used  Substance and Sexual Activity  . Alcohol use: Not Currently    Comment: not drank in while  . Drug use: Yes    Types: Marijuana    Comment: 2 puffs a week  . Sexual activity: Not Currently  Other Topics Concern  . Not on file  Social History Narrative  . Not on file   Social Determinants of  Health   Financial Resource Strain: Not on file  Food Insecurity: Not on file  Transportation Needs: Not on file  Physical Activity: Not on file  Stress: Not on file  Social Connections: Not on file  Intimate Partner Violence: Not on file    Family History  Problem Relation Age of Onset  . Hypertension Mother   . Diabetes Mother   . Stroke Mother   . Colon polyps Sister   . Breast cancer Neg Hx      Current Outpatient Medications:  .  famotidine (PEPCID) 20 MG tablet, TAKE 1 TABLET BY MOUTH TWICE A DAY, Disp: 180 tablet, Rfl: 2 .  lovastatin (MEVACOR) 40 MG tablet, TAKE 1 TABLET BY MOUTH EVERY DAY, Disp: 90 tablet, Rfl: 3 .  valsartan-hydrochlorothiazide (DIOVAN-HCT) 160-25 MG tablet, TAKE 1 TABLET BY MOUTH EVERY DAY, Disp: 90 tablet, Rfl: 2 .  amLODipine (NORVASC) 10 MG tablet, TAKE 1 TABLET BY MOUTH EVERY DAY (Patient not taking: No sig reported), Disp: 90 tablet, Rfl: 2 .  aspirin EC 81 MG tablet, Take 81 mg by mouth daily. (Patient not taking: No sig reported), Disp: , Rfl:   Physical exam:  Vitals:   02/20/21 1036  BP: (!) 147/72  Pulse: 80  Resp: 20  Temp: (!) 96.5 F (35.8 C)  TempSrc: Tympanic  SpO2: 96%  Weight: 199 lb 9.6 oz (90.5 kg)   Physical Exam Constitutional:      General: She is not in acute distress. Cardiovascular:     Rate and Rhythm: Normal rate and regular rhythm.     Heart sounds: Normal heart sounds.  Pulmonary:     Effort: Pulmonary effort is normal.     Breath sounds: Normal breath sounds.  Abdominal:     General: Bowel sounds are normal.     Palpations: Abdomen is soft.  Lymphadenopathy:     Comments: No palpable cervical, supraclavicular, axillary or inguinal adenopathy   Skin:    General: Skin is warm and dry.  Neurological:     Mental Status: She is alert and oriented to person, place, and time.      CMP Latest Ref Rng & Units 04/21/2020  Sodium 135 - 146 mmol/L 139  Potassium 3.5 - 5.3 mmol/L 3.7  Chloride 98 - 110 mmol/L  103  CO2 20 - 32 mmol/L 22   CBC Latest Ref Rng & Units 02/20/2021  WBC 4.0 - 10.5 K/uL 8.3  Hemoglobin 12.0 - 15.0 g/dL 13.7  Hematocrit 36.0 - 46.0 % 40.7  Platelets 150 - 400 K/uL 307      Assessment and plan- Patient is a 70 y.o. female referred for lymphocytosis now resolved  Patient had a transient episode of lymphocytosis in the past for which she was referred to [place].Over the last 1 year we have had her CBC checked 3 times.  Her white cell count is normal with an H&H of 13.7/40.7 and a platelet count of 307.  White count was 8.3 with a normal differential.  She therefore does not require any further follow-up of lymphocytosis and does not  require any follow-up with me at this time.  Weight loss: Patient states that she is lost 26 pounds over the last 1 year which was partly intentional.  If patient continues to have significant weight loss unintentionally she may require imaging to rule out malignancy which can be done by Dr. Rebecka Apley.   Visit Diagnosis 1. Lymphocytosis      Dr. Randa Evens, MD, MPH [facility] [phone] 02/20/2021 1:28 PM

## 2021-07-21 ENCOUNTER — Other Ambulatory Visit: Payer: Self-pay | Admitting: Internal Medicine

## 2021-07-21 DIAGNOSIS — E662 Morbid (severe) obesity with alveolar hypoventilation: Secondary | ICD-10-CM

## 2021-08-24 ENCOUNTER — Other Ambulatory Visit: Payer: Self-pay | Admitting: Internal Medicine

## 2021-10-28 ENCOUNTER — Other Ambulatory Visit: Payer: Self-pay | Admitting: Internal Medicine

## 2022-02-09 ENCOUNTER — Other Ambulatory Visit: Payer: Self-pay | Admitting: Internal Medicine

## 2022-02-12 ENCOUNTER — Other Ambulatory Visit: Payer: Self-pay | Admitting: Internal Medicine

## 2022-03-02 ENCOUNTER — Other Ambulatory Visit: Payer: Self-pay | Admitting: *Deleted

## 2022-03-02 DIAGNOSIS — E662 Morbid (severe) obesity with alveolar hypoventilation: Secondary | ICD-10-CM

## 2022-03-02 MED ORDER — VALSARTAN-HYDROCHLOROTHIAZIDE 160-25 MG PO TABS: 1.0000 | ORAL_TABLET | Freq: Every day | ORAL | 2 refills | Status: DC

## 2022-03-02 MED ORDER — LOVASTATIN 40 MG PO TABS: 40.0000 mg | ORAL_TABLET | Freq: Every day | ORAL | 3 refills | Status: AC

## 2022-03-02 MED ORDER — FAMOTIDINE 20 MG PO TABS: 20.0000 mg | ORAL_TABLET | Freq: Two times a day (BID) | ORAL | 2 refills | Status: DC

## 2022-05-17 ENCOUNTER — Other Ambulatory Visit: Payer: Self-pay | Admitting: Internal Medicine

## 2022-05-17 DIAGNOSIS — Z139 Encounter for screening, unspecified: Secondary | ICD-10-CM

## 2022-05-21 ENCOUNTER — Ambulatory Visit
Admission: RE | Admit: 2022-05-21 | Discharge: 2022-05-21 | Disposition: A | Discharge status: Home / Self Care | Payer: Medicare Other | Source: Ambulatory Visit | Attending: Internal Medicine | Admitting: Internal Medicine

## 2022-05-21 DIAGNOSIS — Z139 Encounter for screening, unspecified: Secondary | ICD-10-CM

## 2022-07-27 ENCOUNTER — Other Ambulatory Visit: Payer: Self-pay | Admitting: Nurse Practitioner

## 2022-08-15 ENCOUNTER — Other Ambulatory Visit: Payer: Self-pay | Admitting: Internal Medicine

## 2022-08-15 DIAGNOSIS — Z13820 Encounter for screening for osteoporosis: Secondary | ICD-10-CM

## 2022-08-24 ENCOUNTER — Ambulatory Visit
Admission: RE | Admit: 2022-08-24 | Discharge: 2022-08-24 | Disposition: A | Discharge status: Home / Self Care | Payer: Medicare Other | Source: Ambulatory Visit | Attending: Internal Medicine | Admitting: Internal Medicine

## 2022-08-24 DIAGNOSIS — Z13820 Encounter for screening for osteoporosis: Secondary | ICD-10-CM

## 2022-09-06 ENCOUNTER — Other Ambulatory Visit: Payer: Self-pay | Admitting: Nurse Practitioner

## 2022-12-10 ENCOUNTER — Other Ambulatory Visit: Payer: Self-pay | Admitting: Nurse Practitioner

## 2022-12-10 DIAGNOSIS — E662 Morbid (severe) obesity with alveolar hypoventilation: Secondary | ICD-10-CM

## 2022-12-27 ENCOUNTER — Other Ambulatory Visit: Payer: Self-pay | Admitting: Nurse Practitioner

## 2022-12-27 DIAGNOSIS — E662 Morbid (severe) obesity with alveolar hypoventilation: Secondary | ICD-10-CM

## 2023-01-11 ENCOUNTER — Other Ambulatory Visit: Payer: Self-pay

## 2023-01-11 ENCOUNTER — Telehealth: Payer: Self-pay

## 2023-01-11 DIAGNOSIS — Z86010 Personal history of colonic polyps: Secondary | ICD-10-CM

## 2023-01-11 MED ORDER — NA SULFATE-K SULFATE-MG SULF 17.5-3.13-1.6 GM/177ML PO SOLN: 1.0000 | Freq: Once | ORAL | 0 refills | Status: AC

## 2023-01-11 NOTE — Telephone Encounter (Signed)
Gastroenterology Pre-Procedure Review  Request Date: 03/01/23  Requesting Physician: Dr. Allen Norris  PATIENT REVIEW QUESTIONS: The patient responded to the following health history questions as indicated:    1. Are you having any GI issues? no 2. Do you have a personal history of Polyps? yes (last colonoscopy performed by Dr. Allen Norris 08/31/16) 3. Do you have a family history of Colon Cancer or Polyps? no 4. Diabetes Mellitus? no 5. Joint replacements in the past 12 months?no 6. Major health problems in the past 3 months?no 7. Any artificial heart valves, MVP, or defibrillator?no    MEDICATIONS & ALLERGIES:    Patient reports the following regarding taking any anticoagulation/antiplatelet therapy:   Plavix, Coumadin, Eliquis, Xarelto, Lovenox, Pradaxa, Brilinta, or Effient? no Aspirin? yes (81 mg)  Patient confirms/reports the following medications:  Current Outpatient Medications  Medication Sig Dispense Refill   amLODipine (NORVASC) 10 MG tablet TAKE 1 TABLET BY MOUTH EVERY DAY (Patient not taking: No sig reported) 90 tablet 2   aspirin EC 81 MG tablet Take 81 mg by mouth daily. (Patient not taking: No sig reported)     famotidine (PEPCID) 20 MG tablet TAKE 1 TABLET BY MOUTH TWICE  DAILY 200 tablet 2   lovastatin (MEVACOR) 40 MG tablet Take 1 tablet (40 mg total) by mouth daily. 90 tablet 3   valsartan-hydrochlorothiazide (DIOVAN-HCT) 160-25 MG tablet TAKE 1 TABLET BY MOUTH DAILY 100 tablet 2   No current facility-administered medications for this visit.    Patient confirms/reports the following allergies:  Allergies  Allergen Reactions   Ace Inhibitors     Swelling, ? angioedema   Penicillins     Other reaction(s): ITCHING    No orders of the defined types were placed in this encounter.   AUTHORIZATION INFORMATION Primary Insurance: 1D#: Group #:  Secondary Insurance: 1D#: Group #:  SCHEDULE INFORMATION: Date: 03/01/23 Time: Location: [facility]

## 2023-02-05 ENCOUNTER — Other Ambulatory Visit: Payer: Self-pay | Admitting: Nurse Practitioner

## 2023-02-05 DIAGNOSIS — E662 Morbid (severe) obesity with alveolar hypoventilation: Secondary | ICD-10-CM

## 2023-02-06 ENCOUNTER — Other Ambulatory Visit: Payer: Self-pay

## 2023-02-06 NOTE — Telephone Encounter (Signed)
I called the patient and LVM  to inform her that she needs an appointment to get her medication because she has never seen [facility] here.  Silvester Reierson,cma

## 2023-02-17 ENCOUNTER — Other Ambulatory Visit: Payer: Self-pay | Admitting: Nurse Practitioner

## 2023-02-17 DIAGNOSIS — E662 Morbid (severe) obesity with alveolar hypoventilation: Secondary | ICD-10-CM

## 2023-02-25 ENCOUNTER — Telehealth: Payer: Self-pay

## 2023-02-25 NOTE — Telephone Encounter (Signed)
Patient has rescheduled her procedure date to 03/15/23 with Dr. Servando Snare at [facility] due to being sick.  Referral updated.  Instructions updated.  Thanks,  [place], [place]

## 2023-03-07 ENCOUNTER — Encounter: Payer: Self-pay | Admitting: Gastroenterology

## 2023-03-08 ENCOUNTER — Encounter: Payer: Self-pay | Admitting: Gastroenterology

## 2023-03-13 ENCOUNTER — Other Ambulatory Visit: Payer: Self-pay | Admitting: Nurse Practitioner

## 2023-03-13 DIAGNOSIS — E662 Morbid (severe) obesity with alveolar hypoventilation: Secondary | ICD-10-CM

## 2023-03-15 ENCOUNTER — Other Ambulatory Visit: Payer: Self-pay

## 2023-03-15 ENCOUNTER — Ambulatory Visit
Admission: RE | Admit: 2023-03-15 | Discharge: 2023-03-15 | Disposition: A | Discharge status: Home / Self Care | Payer: 59 | Attending: Gastroenterology | Admitting: Gastroenterology

## 2023-03-15 ENCOUNTER — Ambulatory Visit: Payer: 59 | Admitting: Anesthesiology

## 2023-03-15 ENCOUNTER — Encounter: Payer: Self-pay | Admitting: Gastroenterology

## 2023-03-15 ENCOUNTER — Ambulatory Visit
Admission: RE | Disposition: A | Discharge status: Home / Self Care | Payer: Self-pay | Source: Home / Self Care | Attending: Gastroenterology

## 2023-03-15 DIAGNOSIS — Z79899 Other long term (current) drug therapy: Secondary | ICD-10-CM | POA: Diagnosis not present

## 2023-03-15 DIAGNOSIS — I10 Essential (primary) hypertension: Secondary | ICD-10-CM | POA: Diagnosis not present

## 2023-03-15 DIAGNOSIS — K635 Polyp of colon: Secondary | ICD-10-CM | POA: Insufficient documentation

## 2023-03-15 DIAGNOSIS — E7800 Pure hypercholesterolemia, unspecified: Secondary | ICD-10-CM | POA: Insufficient documentation

## 2023-03-15 DIAGNOSIS — K219 Gastro-esophageal reflux disease without esophagitis: Secondary | ICD-10-CM | POA: Insufficient documentation

## 2023-03-15 DIAGNOSIS — K640 First degree hemorrhoids: Secondary | ICD-10-CM | POA: Insufficient documentation

## 2023-03-15 DIAGNOSIS — Z83719 Family history of colon polyps, unspecified: Secondary | ICD-10-CM | POA: Diagnosis not present

## 2023-03-15 DIAGNOSIS — D123 Benign neoplasm of transverse colon: Secondary | ICD-10-CM | POA: Diagnosis not present

## 2023-03-15 DIAGNOSIS — E662 Morbid (severe) obesity with alveolar hypoventilation: Secondary | ICD-10-CM | POA: Insufficient documentation

## 2023-03-15 DIAGNOSIS — F17210 Nicotine dependence, cigarettes, uncomplicated: Secondary | ICD-10-CM | POA: Diagnosis not present

## 2023-03-15 DIAGNOSIS — K5730 Diverticulosis of large intestine without perforation or abscess without bleeding: Secondary | ICD-10-CM | POA: Insufficient documentation

## 2023-03-15 DIAGNOSIS — Z1211 Encounter for screening for malignant neoplasm of colon: Secondary | ICD-10-CM | POA: Diagnosis not present

## 2023-03-15 DIAGNOSIS — Z86010 Personal history of colonic polyps: Secondary | ICD-10-CM | POA: Diagnosis not present

## 2023-03-15 DIAGNOSIS — Z6835 Body mass index (BMI) 35.0-35.9, adult: Secondary | ICD-10-CM | POA: Diagnosis not present

## 2023-03-15 HISTORY — DX: Dizziness and giddiness: R42

## 2023-03-15 HISTORY — PX: POLYPECTOMY: SHX5525

## 2023-03-15 HISTORY — PX: COLONOSCOPY WITH PROPOFOL: SHX5780

## 2023-03-15 HISTORY — DX: Morbid (severe) obesity with alveolar hypoventilation: E66.2

## 2023-03-15 SURGERY — COLONOSCOPY WITH PROPOFOL
Anesthesia: General | Site: Rectum

## 2023-03-15 MED ORDER — LIDOCAINE HCL (CARDIAC) PF 100 MG/5ML IV SOSY
PREFILLED_SYRINGE | INTRAVENOUS | Status: DC | PRN
  Administered 2023-03-15: 60 mg via INTRAVENOUS

## 2023-03-15 MED ORDER — PROPOFOL 10 MG/ML IV BOLUS
INTRAVENOUS | Status: DC | PRN
  Administered 2023-03-15: 40 mg via INTRAVENOUS
  Administered 2023-03-15: 80 mg via INTRAVENOUS

## 2023-03-15 MED ORDER — STERILE WATER FOR IRRIGATION IR SOLN
Status: DC | PRN
  Administered 2023-03-15: 150 mL

## 2023-03-15 MED ORDER — EPHEDRINE SULFATE (PRESSORS) 50 MG/ML IJ SOLN
INTRAMUSCULAR | Status: DC | PRN
  Administered 2023-03-15 (×2): 5 mg via INTRAVENOUS
  Administered 2023-03-15 (×2): 10 mg via INTRAVENOUS

## 2023-03-15 MED ORDER — SODIUM CHLORIDE 0.9 % IV SOLN: INTRAVENOUS | Status: DC

## 2023-03-15 MED ORDER — LACTATED RINGERS IV SOLN: INTRAVENOUS | Status: DC

## 2023-03-15 MED ORDER — GLYCOPYRROLATE 0.2 MG/ML IJ SOLN
INTRAMUSCULAR | Status: DC | PRN
  Administered 2023-03-15: .2 mg via INTRAVENOUS

## 2023-03-15 MED ORDER — PROPOFOL 500 MG/50ML IV EMUL
INTRAVENOUS | Status: DC | PRN
  Administered 2023-03-15: 125 ug/kg/min via INTRAVENOUS

## 2023-03-15 SURGICAL SUPPLY — 21 items

## 2023-03-15 NOTE — Op Note (Signed)
[facility] Gastroenterology Patient Name: Samantha Payne Procedure Date: 03/15/2023 8:14 AM MRN: [id number] Account #: [id number] Date of Birth: 04-13-1951 Admit Type: Outpatient Age: 72 Room: [facility] OR ROOM 01 Gender: Female Note Status: Finalized Instrument Name: [id number] Procedure:             Colonoscopy Indications:           High risk colon cancer surveillance: Personal history                         of colonic polyps Providers:             Midge Minium MD, MD Medicines:             Propofol per Anesthesia Complications:         No immediate complications. Procedure:             Pre-Anesthesia Assessment:                        - Prior to the procedure, a History and Physical was                         performed, and patient medications and allergies were                         reviewed. The patient's tolerance of previous                         anesthesia was also reviewed. The risks and benefits                         of the procedure and the sedation options and risks                         were discussed with the patient. All questions were                         answered, and informed consent was obtained. Prior                         Anticoagulants: The patient has taken no anticoagulant                         or antiplatelet agents. ASA Grade Assessment: II - A                         patient with mild systemic disease. After reviewing                         the risks and benefits, the patient was deemed in                         satisfactory condition to undergo the procedure.                        After obtaining informed consent, the colonoscope was                         passed under direct vision. Throughout the  procedure,                         the patient's blood pressure, pulse, and oxygen                         saturations were monitored continuously. The was                         introduced through the anus and advanced to the the                          cecum, identified by appendiceal orifice and ileocecal                         valve. The colonoscopy was performed without                         difficulty. The patient tolerated the procedure well.                         The quality of the bowel preparation was excellent. Findings:      The perianal and digital rectal examinations were normal.      Three sessile polyps were found in the sigmoid colon. The polyps were 3       to 6 mm in size. These polyps were removed with a cold snare. Resection       and retrieval were complete.      Two sessile polyps were found in the transverse colon. The polyps were 4       to 5 mm in size. These polyps were removed with a cold snare. Resection       and retrieval were complete.      Multiple small-mouthed diverticula were found in the sigmoid colon.      Non-bleeding internal hemorrhoids were found during retroflexion. The       hemorrhoids were Grade I (internal hemorrhoids that do not prolapse). Impression:            - Three 3 to 6 mm polyps in the sigmoid colon, removed                         with a cold snare. Resected and retrieved.                        - Two 4 to 5 mm polyps in the transverse colon,                         removed with a cold snare. Resected and retrieved.                        - Diverticulosis in the sigmoid colon.                        - Non-bleeding internal hemorrhoids. Recommendation:        - Discharge patient to home.                        - Resume previous diet.                        -  Continue present medications.                        - Await pathology results.                        - Repeat colonoscopy in 3 years for surveillance. Procedure Code(s):     --- Professional ---                        [phone], Colonoscopy, flexible; with removal of                         tumor(s), polyp(s), or other lesion(s) by snare                         technique Diagnosis Code(s):     ---  Professional ---                        Z86.010, Personal history of colonic polyps                        D12.3, Benign neoplasm of transverse colon (hepatic                         flexure or splenic flexure) CPT copyright 2022 American Medical Association. All rights reserved. The codes documented in this report are preliminary and upon coder review may  be revised to meet current compliance requirements. Midge Minium MD, MD 03/15/2023 8:56:23 AM This report has been signed electronically. Number of Addenda: 0 Note Initiated On: 03/15/2023 8:14 AM Scope Withdrawal Time: 0 hours 7 minutes 25 seconds  Total Procedure Duration: 0 hours 17 minutes 8 seconds  Estimated Blood Loss:  Estimated blood loss: none.      [facility]

## 2023-03-15 NOTE — Transfer of Care (Signed)
Immediate Anesthesia Transfer of Care Note  Patient: Samantha Payne  Procedure(s) Performed: COLONOSCOPY WITH PROPOFOL (Rectum) POLYPECTOMY (Rectum)  Patient Location: PACU  Anesthesia Type: General  Level of Consciousness: awake, alert  and patient cooperative  Airway and Oxygen Therapy: Patient Spontanous Breathing and Patient connected to supplemental oxygen  Post-op Assessment: Post-op Vital signs reviewed, Patient's Cardiovascular Status Stable, Respiratory Function Stable, Patent Airway and No signs of Nausea or vomiting  Post-op Vital Signs: Reviewed and stable  Complications: No notable events documented.

## 2023-03-15 NOTE — Anesthesia Preprocedure Evaluation (Signed)
Anesthesia Evaluation  Patient identified by MRN, date of birth, ID band Patient awake    Reviewed: Allergy & Precautions, H&P , NPO status , Patient's Chart, lab work & pertinent test results  Airway Mallampati: III  TM Distance: >3 FB Neck ROM: full    Dental  (+) Upper Dentures, Partial Lower, Missing   Pulmonary Current Smoker and Patient abstained from smoking.   Pulmonary exam normal        Cardiovascular hypertension, Normal cardiovascular exam     Neuro/Psych negative neurological ROS  negative psych ROS   GI/Hepatic negative GI ROS, Neg liver ROS,,,  Endo/Other  negative endocrine ROS    Renal/GU negative Renal ROS  negative genitourinary   Musculoskeletal  (+) Arthritis ,    Abdominal Normal abdominal exam  (+)   Peds  Hematology negative hematology ROS (+)   Anesthesia Other Findings Past Medical History: No date: Arthritis     Comment:  legs, back, shoulders No date: GERD (gastroesophageal reflux disease) No date: Hypercholesteremia No date: Hypertension No date: Lymphocytosis No date: Morbid (severe) obesity with alveolar hypoventilation (HCC) No date: Vertigo No date: Wears dentures     Comment:  partial upper and lower  Past Surgical History: No date: CESAREAN SECTION 08/31/2016: COLONOSCOPY WITH PROPOFOL; N/A     Comment:  Procedure: COLONOSCOPY WITH PROPOFOL;  Surgeon: Midge Minium, MD;  Location: [facility];  Service:               Endoscopy;  Laterality: N/A; 08/31/2016: POLYPECTOMY     Comment:  Procedure: POLYPECTOMY;  Surgeon: Midge Minium, MD;                Location: [facility];  Service: Endoscopy;; No date: TOTAL HIP ARTHROPLASTY; Left  BMI    Body Mass Index: 37.51 kg/m      Reproductive/Obstetrics negative OB ROS                             Anesthesia Physical Anesthesia Plan  ASA: 2  Anesthesia Plan: General    Post-op Pain Management:    Induction: Intravenous  PONV Risk Score and Plan: Propofol infusion and TIVA  Airway Management Planned: Natural Airway  Additional Equipment:   Intra-op Plan:   Post-operative Plan:   Informed Consent: I have reviewed the patients History and Physical, chart, labs and discussed the procedure including the risks, benefits and alternatives for the proposed anesthesia with the patient or authorized representative who has indicated his/her understanding and acceptance.     Dental Advisory Given  Plan Discussed with: CRNA and Surgeon  Anesthesia Plan Comments:         Anesthesia Quick Evaluation

## 2023-03-15 NOTE — Anesthesia Postprocedure Evaluation (Signed)
Anesthesia Post Note  Patient: Samantha Payne  Procedure(s) Performed: COLONOSCOPY WITH PROPOFOL (Rectum) POLYPECTOMY (Rectum)  Patient location during evaluation: PACU Anesthesia Type: General Level of consciousness: awake and alert Pain management: pain level controlled Vital Signs Assessment: post-procedure vital signs reviewed and stable Respiratory status: spontaneous breathing, nonlabored ventilation and respiratory function stable Cardiovascular status: blood pressure returned to baseline and stable Postop Assessment: no apparent nausea or vomiting Anesthetic complications: no   No notable events documented.   Last Vitals:  Vitals:   03/15/23 0824 03/15/23 0900  BP: (!) 150/80 132/71  Pulse:  95  Resp: 13 (!) 22  Temp: (!) 36.2 C 36.6 C  SpO2: 96% 99%    Last Pain:  Vitals:   03/15/23 0911  TempSrc:   PainSc: 0-No pain                 Foye Deer

## 2023-03-15 NOTE — H&P (Signed)
Samantha Minium, MD [facility] [address] [place], [place] [address] Phone:[phone] Fax : [phone]  Primary Care Physician:  [facility] Primary Gastroenterologist:  Dr. Servando Snare  Pre-Procedure History & Physical: HPI:  Samantha Payne is a 72 y.o. female is here for an colonoscopy.   Past Medical History:  Diagnosis Date   Arthritis    legs, back, shoulders   GERD (gastroesophageal reflux disease)    Hypercholesteremia    Hypertension    Lymphocytosis    Morbid (severe) obesity with alveolar hypoventilation (HCC)    Vertigo    Wears dentures    partial upper and lower    Past Surgical History:  Procedure Laterality Date   CESAREAN SECTION     COLONOSCOPY WITH PROPOFOL N/A 08/31/2016   Procedure: COLONOSCOPY WITH PROPOFOL;  Surgeon: Samantha Minium, MD;  Location: [facility];  Service: Endoscopy;  Laterality: N/A;   POLYPECTOMY  08/31/2016   Procedure: POLYPECTOMY;  Surgeon: Samantha Minium, MD;  Location: [facility];  Service: Endoscopy;;   TOTAL HIP ARTHROPLASTY Left     Prior to Admission medications   Medication Sig Start Date End Date Taking? Authorizing Provider  aspirin EC 81 MG tablet Take 81 mg by mouth daily.   Yes [provider]  famotidine (PEPCID) 20 MG tablet TAKE 1 TABLET BY MOUTH TWICE  DAILY 07/30/22  Yes Masoud, Renda Rolls, MD  fluticasone (FLONASE) 50 MCG/ACT nasal spray Place into the nose.   Yes [provider]  loratadine (CLARITIN) 10 MG tablet Take by mouth.   Yes [provider]  lovastatin (MEVACOR) 40 MG tablet Take 1 tablet (40 mg total) by mouth daily. 03/02/22  Yes Kara Dies, NP  Na Sulfate-K Sulfate-Mg Sulf 17.5-3.13-1.6 GM/177ML SOLN See admin instructions. 01/11/23  Yes [provider]  TURMERIC-GINGER PO Take by mouth.   Yes [provider]  valsartan-hydrochlorothiazide (DIOVAN-HCT) 160-25 MG tablet TAKE 1 TABLET BY MOUTH DAILY 09/07/22  Yes Corky Downs, MD  VITAMIN D PO Take by mouth.   Yes [provider]    Allergies as of 01/11/2023 - Review Complete 02/20/2021  Allergen Reaction Noted   Ace inhibitors  09/16/2013   Penicillins  03/26/2013    Family History  Problem Relation Age of Onset   Hypertension Mother    Diabetes Mother    Stroke Mother    Colon polyps Sister    Breast cancer Neg Hx     Social History   Socioeconomic History   Marital status: Single    Spouse name: Not on file   Number of children: Not on file   Years of education: Not on file   Highest education level: Not on file  Occupational History   Not on file  Tobacco Use   Smoking status: Every Day    Packs/day: 0.50    Years: 50.00    Additional pack years: 0.00    Total pack years: 25.00    Types: Cigarettes   Smokeless tobacco: Never  Vaping Use   Vaping Use: Never used  Substance and Sexual Activity   Alcohol use: Not Currently    Comment: not drank in while   Drug use: Yes    Types: Marijuana    Comment: 2 puffs a week   Sexual activity: Not Currently  Other Topics Concern   Not on file  Social History Narrative   Not on file   Social Determinants of Health   Financial Resource Strain: Not  on file  Food Insecurity: Not on file  Transportation Needs: Not on file  Physical Activity: Not on file  Stress: Not on file  Social Connections: Not on file  Intimate Partner Violence: Not on file    Review of Systems: See HPI, otherwise negative ROS  Physical Exam: Ht 5\' 3"  (1.6 m)   Wt 90.7 kg   BMI 35.43 kg/m  General:   Alert,  pleasant and cooperative in NAD Head:  Normocephalic and atraumatic. Neck:  Supple; no masses or thyromegaly. Lungs:  Clear throughout to auscultation.    Heart:  Regular rate and rhythm. Abdomen:  Soft, nontender and nondistended. Normal bowel sounds, without guarding, and without rebound.   Neurologic:  Alert and  oriented x4;  grossly normal  neurologically.  Impression/Plan: [name] is here for an colonoscopy to be performed for a history of adenomatous polyps on 2017   Risks, benefits, limitations, and alternatives regarding  colonoscopy have been reviewed with the patient.  Questions have been answered.  All parties agreeable.   Samantha Minium, MD  03/15/2023, 8:18 AM

## 2023-03-18 ENCOUNTER — Encounter: Payer: Self-pay | Admitting: Gastroenterology

## 2023-03-18 LAB — SURGICAL PATHOLOGY

## 2023-05-20 ENCOUNTER — Other Ambulatory Visit: Payer: Self-pay | Admitting: Internal Medicine

## 2023-05-20 DIAGNOSIS — Z1231 Encounter for screening mammogram for malignant neoplasm of breast: Secondary | ICD-10-CM

## 2023-12-13 ENCOUNTER — Other Ambulatory Visit: Payer: Self-pay | Admitting: Internal Medicine

## 2023-12-13 DIAGNOSIS — Z720 Tobacco use: Secondary | ICD-10-CM

## 2023-12-13 DIAGNOSIS — Z122 Encounter for screening for malignant neoplasm of respiratory organs: Secondary | ICD-10-CM

## 2023-12-31 ENCOUNTER — Ambulatory Visit
Admission: RE | Admit: 2023-12-31 | Discharge: 2023-12-31 | Disposition: A | Discharge status: Home / Self Care | Payer: 59 | Source: Ambulatory Visit | Attending: Internal Medicine | Admitting: Internal Medicine

## 2023-12-31 DIAGNOSIS — Z720 Tobacco use: Secondary | ICD-10-CM | POA: Diagnosis present

## 2023-12-31 DIAGNOSIS — F17210 Nicotine dependence, cigarettes, uncomplicated: Secondary | ICD-10-CM | POA: Diagnosis not present

## 2023-12-31 DIAGNOSIS — R918 Other nonspecific abnormal finding of lung field: Secondary | ICD-10-CM | POA: Insufficient documentation

## 2023-12-31 DIAGNOSIS — E041 Nontoxic single thyroid nodule: Secondary | ICD-10-CM | POA: Insufficient documentation

## 2023-12-31 DIAGNOSIS — I2510 Atherosclerotic heart disease of native coronary artery without angina pectoris: Secondary | ICD-10-CM | POA: Diagnosis not present

## 2023-12-31 DIAGNOSIS — Z122 Encounter for screening for malignant neoplasm of respiratory organs: Secondary | ICD-10-CM | POA: Insufficient documentation

## 2023-12-31 DIAGNOSIS — J439 Emphysema, unspecified: Secondary | ICD-10-CM | POA: Diagnosis not present

## 2023-12-31 DIAGNOSIS — I700 Atherosclerosis of aorta: Secondary | ICD-10-CM | POA: Diagnosis not present

## 2023-12-31 DIAGNOSIS — K449 Diaphragmatic hernia without obstruction or gangrene: Secondary | ICD-10-CM | POA: Diagnosis not present

## 2024-01-09 ENCOUNTER — Other Ambulatory Visit: Payer: Self-pay | Admitting: Internal Medicine

## 2024-01-09 DIAGNOSIS — E041 Nontoxic single thyroid nodule: Secondary | ICD-10-CM

## 2024-01-17 ENCOUNTER — Ambulatory Visit
Admission: RE | Admit: 2024-01-17 | Discharge: 2024-01-17 | Disposition: A | Discharge status: Home / Self Care | Source: Ambulatory Visit | Attending: Internal Medicine | Admitting: Internal Medicine

## 2024-01-17 DIAGNOSIS — E041 Nontoxic single thyroid nodule: Secondary | ICD-10-CM | POA: Diagnosis present

## 2024-01-22 ENCOUNTER — Ambulatory Visit
Admission: RE | Admit: 2024-01-22 | Discharge: 2024-01-22 | Disposition: A | Discharge status: Home / Self Care | Payer: 59 | Source: Ambulatory Visit | Attending: Internal Medicine | Admitting: Internal Medicine

## 2024-01-22 DIAGNOSIS — Z1231 Encounter for screening mammogram for malignant neoplasm of breast: Secondary | ICD-10-CM | POA: Diagnosis present

## 2024-03-20 ENCOUNTER — Other Ambulatory Visit: Payer: Self-pay | Admitting: Internal Medicine

## 2024-03-20 DIAGNOSIS — Z780 Asymptomatic menopausal state: Secondary | ICD-10-CM

## 2024-07-22 ENCOUNTER — Other Ambulatory Visit: Payer: Self-pay | Admitting: Internal Medicine

## 2024-07-22 DIAGNOSIS — R918 Other nonspecific abnormal finding of lung field: Secondary | ICD-10-CM

## 2024-07-22 DIAGNOSIS — J439 Emphysema, unspecified: Secondary | ICD-10-CM

## 2024-07-29 ENCOUNTER — Ambulatory Visit
Admission: RE | Admit: 2024-07-29 | Discharge: 2024-07-29 | Disposition: A | Discharge status: Home / Self Care | Source: Ambulatory Visit | Attending: Internal Medicine | Admitting: Internal Medicine

## 2024-07-29 DIAGNOSIS — J439 Emphysema, unspecified: Secondary | ICD-10-CM | POA: Diagnosis present

## 2024-07-29 DIAGNOSIS — R918 Other nonspecific abnormal finding of lung field: Secondary | ICD-10-CM | POA: Diagnosis present

## 2024-08-25 ENCOUNTER — Ambulatory Visit
Admission: RE | Admit: 2024-08-25 | Discharge: 2024-08-25 | Disposition: A | Discharge status: Home / Self Care | Source: Ambulatory Visit | Attending: Internal Medicine | Admitting: Internal Medicine

## 2024-08-25 DIAGNOSIS — Z780 Asymptomatic menopausal state: Secondary | ICD-10-CM | POA: Insufficient documentation
